# Patient Record
Sex: Male | Born: 1960 | Race: White | Hispanic: No | Marital: Single | Smoking: Current every day smoker
Health system: Southern US, Community
[De-identification: ages and names within clinical notes are randomized; demographics above are authoritative.]

## PROBLEM LIST (undated history)

## (undated) DIAGNOSIS — Z8619 Personal history of other infectious and parasitic diseases: Secondary | ICD-10-CM

## (undated) DIAGNOSIS — K219 Gastro-esophageal reflux disease without esophagitis: Secondary | ICD-10-CM

## (undated) DIAGNOSIS — G2581 Restless legs syndrome: Secondary | ICD-10-CM

## (undated) DIAGNOSIS — R42 Dizziness and giddiness: Secondary | ICD-10-CM

## (undated) DIAGNOSIS — F17210 Nicotine dependence, cigarettes, uncomplicated: Secondary | ICD-10-CM

## (undated) DIAGNOSIS — K5790 Diverticulosis of intestine, part unspecified, without perforation or abscess without bleeding: Secondary | ICD-10-CM

## (undated) DIAGNOSIS — Z8673 Personal history of transient ischemic attack (TIA), and cerebral infarction without residual deficits: Secondary | ICD-10-CM

## (undated) DIAGNOSIS — R600 Localized edema: Secondary | ICD-10-CM

## (undated) DIAGNOSIS — F17200 Nicotine dependence, unspecified, uncomplicated: Secondary | ICD-10-CM

## (undated) DIAGNOSIS — G4700 Insomnia, unspecified: Secondary | ICD-10-CM

## (undated) DIAGNOSIS — R5383 Other fatigue: Secondary | ICD-10-CM

## (undated) DIAGNOSIS — B354 Tinea corporis: Secondary | ICD-10-CM

## (undated) DIAGNOSIS — IMO0001 Reserved for inherently not codable concepts without codable children: Secondary | ICD-10-CM

## (undated) DIAGNOSIS — I10 Essential (primary) hypertension: Secondary | ICD-10-CM

## (undated) HISTORY — DX: Nicotine dependence, cigarettes, uncomplicated: F17.210

## (undated) HISTORY — DX: Nicotine dependence, unspecified, uncomplicated: F17.200

## (undated) HISTORY — DX: Personal history of other infectious and parasitic diseases: Z86.19

## (undated) HISTORY — DX: Insomnia, unspecified: G47.00

## (undated) HISTORY — DX: Localized edema: R60.0

## (undated) HISTORY — DX: Other fatigue: R53.83

## (undated) HISTORY — DX: Personal history of transient ischemic attack (TIA), and cerebral infarction without residual deficits: Z86.73

## (undated) HISTORY — DX: Reserved for inherently not codable concepts without codable children: IMO0001

## (undated) HISTORY — DX: Gastro-esophageal reflux disease without esophagitis: K21.9

## (undated) HISTORY — DX: Restless legs syndrome: G25.81

## (undated) HISTORY — DX: Dizziness and giddiness: R42

## (undated) HISTORY — DX: Essential (primary) hypertension: I10

## (undated) HISTORY — DX: Tinea corporis: B35.4

## (undated) HISTORY — DX: Diverticulosis of intestine, part unspecified, without perforation or abscess without bleeding: K57.90

---

## 2016-01-06 ENCOUNTER — Encounter (HOSPITAL_COMMUNITY): Payer: Self-pay | Admitting: Family Medicine

## 2016-01-06 ENCOUNTER — Emergency Department (HOSPITAL_COMMUNITY)
Admission: EM | Admit: 2016-01-06 | Discharge: 2016-01-06 | Disposition: A | Discharge status: Home / Self Care | Payer: Self-pay | Attending: Emergency Medicine | Admitting: Emergency Medicine

## 2016-01-06 ENCOUNTER — Emergency Department (HOSPITAL_COMMUNITY): Payer: Self-pay

## 2016-01-06 DIAGNOSIS — F17200 Nicotine dependence, unspecified, uncomplicated: Secondary | ICD-10-CM | POA: Insufficient documentation

## 2016-01-06 DIAGNOSIS — R079 Chest pain, unspecified: Secondary | ICD-10-CM | POA: Insufficient documentation

## 2016-01-06 DIAGNOSIS — Z79899 Other long term (current) drug therapy: Secondary | ICD-10-CM | POA: Insufficient documentation

## 2016-01-06 LAB — BASIC METABOLIC PANEL
ANION GAP: 12 (ref 5–15)
BUN: 8 mg/dL (ref 6–20)
CHLORIDE: 108 mmol/L (ref 101–111)
CO2: 20 mmol/L — AB (ref 22–32)
CREATININE: 0.95 mg/dL (ref 0.61–1.24)
Calcium: 9.4 mg/dL (ref 8.9–10.3)
GFR calc non Af Amer: >60 mL/min (ref >60)
GLUCOSE: 107 mg/dL — AB (ref 65–99)
Potassium: 4.1 mmol/L (ref 3.5–5.1)
Sodium: 140 mmol/L (ref 135–145)

## 2016-01-06 LAB — CBC
HCT: 46.5 % (ref 39.0–52.0)
HEMOGLOBIN: 16.1 g/dL (ref 13.0–17.0)
MCH: 31.3 pg (ref 26.0–34.0)
MCHC: 34.6 g/dL (ref 30.0–36.0)
MCV: 90.5 fL (ref 78.0–100.0)
Platelets: 268 10*3/uL (ref 150–400)
RBC: 5.14 MIL/uL (ref 4.22–5.81)
RDW: 12.3 % (ref 11.5–15.5)
WBC: 9.8 10*3/uL (ref 4.0–10.5)

## 2016-01-06 LAB — I-STAT TROPONIN, ED
Troponin i, poc: 0 ng/mL (ref 0.00–0.08)
Troponin i, poc: 0 ng/mL (ref 0.00–0.08)

## 2016-01-06 LAB — D-DIMER, QUANTITATIVE (NOT AT ARMC): D DIMER QUANT: 0.39 ug{FEU}/mL (ref 0.00–0.50)

## 2016-01-06 MED ORDER — CYCLOBENZAPRINE HCL 5 MG PO TABS: 5.0000 mg | ORAL_TABLET | Freq: Three times a day (TID) | ORAL | Status: DC | PRN

## 2016-01-06 MED ORDER — ASPIRIN 81 MG PO CHEW
324.0000 mg | CHEWABLE_TABLET | Freq: Once | ORAL | Status: AC
  Administered 2016-01-06: 324 mg via ORAL
  Filled 2016-01-06: qty 4

## 2016-01-06 MED ORDER — IBUPROFEN 400 MG PO TABS: 400.0000 mg | ORAL_TABLET | Freq: Four times a day (QID) | ORAL | Status: DC | PRN

## 2016-01-06 NOTE — ED Provider Notes (Signed)
CSN: [id number]     Arrival date & time 01/06/16  1810 History   First MD Initiated Contact with Patient 01/06/16 2152     Chief Complaint  Patient presents with  . Chest Pain   HPI Pt had sharp pain last week in his left chest shooting down the left hand left face.  The pain has been coming and going.  It is in the left chest.  It has lasted most of the day today.  No trouble with breathing.  No swelling anywhere.  Nothing seems to make it better or worse.   Today he had to come home from work and it bothered him to walk up the incline. History reviewed. No pertinent past medical history. History reviewed. No pertinent past surgical history. History reviewed. No pertinent family history. Social History  Substance Use Topics  . Smoking status: Current Every Day Smoker -- 0.50 packs/day  . Smokeless tobacco: None  . Alcohol Use: No    Review of Systems  All other systems reviewed and are negative.     Allergies  Review of patient's allergies indicates no known allergies.  Home Medications   Prior to Admission medications   Medication Sig Start Date End Date Taking? Authorizing Provider  cyclobenzaprine (FLEXERIL) 5 MG tablet Take 1 tablet (5 mg total) by mouth 3 (three) times daily as needed for muscle spasms. 01/06/16   Linwood DibblesJon Liela Rylee, MD  ibuprofen (ADVIL,MOTRIN) 400 MG tablet Take 1 tablet (400 mg total) by mouth every 6 (six) hours as needed. 01/06/16   Linwood DibblesJon Elvia Aydin, MD  ranitidine (ZANTAC) 150 MG tablet Take 150 mg by mouth 2 (two) times daily.   Yes Historical Provider, MD   BP 139/81 mmHg  Pulse 57  Temp(Src) 97.8 F (36.6 C)  Resp 19  Ht 5\' 9"  (1.753 m)  Wt 87.091 kg  BMI 28.34 kg/m2  SpO2 96% Physical Exam  Constitutional: He appears well-developed and well-nourished. No distress.  HENT:  Head: Normocephalic and atraumatic.  Right Ear: External ear normal.  Left Ear: External ear normal.  Eyes: Conjunctivae are normal. Right eye exhibits no discharge. Left eye exhibits  no discharge. No scleral icterus.  Neck: Neck supple. No tracheal deviation present.  ttp parapsinal region  Cardiovascular: Normal rate, regular rhythm and intact distal pulses.   Pulmonary/Chest: Effort normal and breath sounds normal. No stridor. No respiratory distress. He has no wheezes. He has no rales.  Abdominal: Soft. Bowel sounds are normal. He exhibits no distension. There is no tenderness. There is no rebound and no guarding.  Musculoskeletal: He exhibits no edema or tenderness.  Neurological: He is alert. He has normal strength. No cranial nerve deficit (no facial droop, extraocular movements intact, no slurred speech) or sensory deficit. He exhibits normal muscle tone. He displays no seizure activity. Coordination normal.  Skin: Skin is warm and dry. No rash noted.  Psychiatric: He has a normal mood and affect.  Nursing note and vitals reviewed.   ED Course  Procedures (including critical care time) Labs Review Labs Reviewed  BASIC METABOLIC PANEL - Abnormal; Notable for the following:    CO2 20 (*)    Glucose, Bld 107 (*)    All other components within normal limits  CBC  D-DIMER, QUANTITATIVE (NOT AT [facility])  I-STAT TROPOININ, ED  Rosezena SensorI-STAT TROPOININ, ED    Imaging Review Dg Chest 2 View  01/06/2016  CLINICAL DATA:  Acute onset of left anterior chest pain, radiating down the left arm, with numbness.  Left facial numbness. Initial encounter. EXAM: CHEST  2 VIEW COMPARISON:  None. FINDINGS: The lungs are well-aerated. Peribronchial thickening is noted. There is no evidence of focal opacification, pleural effusion or pneumothorax. The heart is normal in size; the mediastinal contour is within normal limits. No acute osseous abnormalities are seen. IMPRESSION: Peribronchial thickening noted.  Lungs otherwise grossly clear. Electronically Signed   By: Roanna Raider M.D.   On: 01/06/2016 18:57   I have personally reviewed and evaluated these images and lab results as part of my  medical decision-making.   EKG Interpretation   Date/Time:  Friday January 06 2016 18:18:55 EST Ventricular Rate:  62 PR Interval:  174 QRS Duration: 108 QT Interval:  400 QTC Calculation: 406 R Axis:   -58 Text Interpretation:  Normal sinus rhythm Pulmonary disease pattern  Incomplete right bundle branch block Left anterior fascicular block  Abnormal ECG No previous tracing Confirmed by Saraya Tirey  MD-J, Tayley Mudrick ([id number]) on  01/06/2016 9:52:38 PM      MDM   Final diagnoses:  Chest pain, unspecified chest pain type     Sx are atypical for heart disease.  Heart score of 3.  Serial cardiac enzymes and d dimer are negative.  Doubt ACS or PE.  May be radicular type pain.  Will dc home with rx for pain.  Follow up with a PCP.  Consider outpatient stress test.  Return for recurretn symptoms    Linwood Dibbles, MD 01/06/16 2320

## 2016-01-06 NOTE — ED Notes (Signed)
Pt here for chest pain that started this am. sts radiating down left arm. sts numbness also. sts last week he had an episode of left facial numbness, arm, and leg that lasted 20 minutes.

## 2016-01-06 NOTE — Discharge Instructions (Signed)

## 2016-11-12 ENCOUNTER — Encounter (HOSPITAL_COMMUNITY): Payer: Self-pay | Admitting: Emergency Medicine

## 2016-11-12 ENCOUNTER — Emergency Department (HOSPITAL_COMMUNITY)
Admission: EM | Admit: 2016-11-12 | Discharge: 2016-11-12 | Disposition: A | Discharge status: Home / Self Care | Payer: No Typology Code available for payment source | Attending: Emergency Medicine | Admitting: Emergency Medicine

## 2016-11-12 DIAGNOSIS — Y999 Unspecified external cause status: Secondary | ICD-10-CM | POA: Insufficient documentation

## 2016-11-12 DIAGNOSIS — Y9389 Activity, other specified: Secondary | ICD-10-CM | POA: Diagnosis not present

## 2016-11-12 DIAGNOSIS — S46819A Strain of other muscles, fascia and tendons at shoulder and upper arm level, unspecified arm, initial encounter: Secondary | ICD-10-CM | POA: Insufficient documentation

## 2016-11-12 DIAGNOSIS — Y92410 Unspecified street and highway as the place of occurrence of the external cause: Secondary | ICD-10-CM | POA: Diagnosis not present

## 2016-11-12 DIAGNOSIS — F17210 Nicotine dependence, cigarettes, uncomplicated: Secondary | ICD-10-CM | POA: Diagnosis not present

## 2016-11-12 DIAGNOSIS — S4990XA Unspecified injury of shoulder and upper arm, unspecified arm, initial encounter: Secondary | ICD-10-CM | POA: Diagnosis present

## 2016-11-12 MED ORDER — IBUPROFEN 600 MG PO TABS: 600.0000 mg | ORAL_TABLET | Freq: Four times a day (QID) | ORAL | 0 refills | Status: DC | PRN

## 2016-11-12 MED ORDER — CYCLOBENZAPRINE HCL 10 MG PO TABS: 10.0000 mg | ORAL_TABLET | Freq: Three times a day (TID) | ORAL | 0 refills | Status: DC

## 2016-11-12 MED ORDER — IBUPROFEN 800 MG PO TABS
800.0000 mg | ORAL_TABLET | Freq: Once | ORAL | Status: AC
  Administered 2016-11-12: 800 mg via ORAL
  Filled 2016-11-12: qty 1

## 2016-11-12 MED ORDER — CYCLOBENZAPRINE HCL 10 MG PO TABS
10.0000 mg | ORAL_TABLET | Freq: Once | ORAL | Status: AC
  Administered 2016-11-12: 10 mg via ORAL
  Filled 2016-11-12: qty 1

## 2016-11-12 MED ORDER — ACETAMINOPHEN 325 MG PO TABS
650.0000 mg | ORAL_TABLET | Freq: Once | ORAL | Status: AC
  Administered 2016-11-12: 650 mg via ORAL
  Filled 2016-11-12: qty 2
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
## 2016-11-21 ENCOUNTER — Emergency Department (HOSPITAL_COMMUNITY)
Admission: EM | Admit: 2016-11-21 | Discharge: 2016-11-21 | Disposition: A | Discharge status: Home / Self Care | Payer: No Typology Code available for payment source | Attending: Emergency Medicine | Admitting: Emergency Medicine

## 2016-11-21 ENCOUNTER — Encounter (HOSPITAL_COMMUNITY): Payer: Self-pay | Admitting: *Deleted

## 2016-11-21 ENCOUNTER — Emergency Department (HOSPITAL_COMMUNITY): Payer: No Typology Code available for payment source

## 2016-11-21 DIAGNOSIS — Y9389 Activity, other specified: Secondary | ICD-10-CM | POA: Insufficient documentation

## 2016-11-21 DIAGNOSIS — Y990 Civilian activity done for income or pay: Secondary | ICD-10-CM | POA: Diagnosis not present

## 2016-11-21 DIAGNOSIS — Z791 Long term (current) use of non-steroidal anti-inflammatories (NSAID): Secondary | ICD-10-CM | POA: Insufficient documentation

## 2016-11-21 DIAGNOSIS — Y92410 Unspecified street and highway as the place of occurrence of the external cause: Secondary | ICD-10-CM | POA: Insufficient documentation

## 2016-11-21 DIAGNOSIS — F17210 Nicotine dependence, cigarettes, uncomplicated: Secondary | ICD-10-CM | POA: Diagnosis not present

## 2016-11-21 DIAGNOSIS — M26622 Arthralgia of left temporomandibular joint: Secondary | ICD-10-CM | POA: Diagnosis not present

## 2016-11-21 DIAGNOSIS — S0993XA Unspecified injury of face, initial encounter: Secondary | ICD-10-CM | POA: Diagnosis present

## 2016-11-21 MED ORDER — NAPROXEN 500 MG PO TABS: 500.0000 mg | ORAL_TABLET | Freq: Two times a day (BID) | ORAL | 0 refills | Status: DC
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
## 2017-10-12 ENCOUNTER — Emergency Department (HOSPITAL_COMMUNITY): Payer: Self-pay

## 2017-10-12 ENCOUNTER — Encounter (HOSPITAL_COMMUNITY): Payer: Self-pay | Admitting: Emergency Medicine

## 2017-10-12 ENCOUNTER — Observation Stay (HOSPITAL_COMMUNITY)
Admission: EM | Admit: 2017-10-12 | Discharge: 2017-10-14 | Disposition: A | Discharge status: Home / Self Care | Payer: Self-pay | Attending: Family Medicine | Admitting: Family Medicine

## 2017-10-12 DIAGNOSIS — R1030 Lower abdominal pain, unspecified: Secondary | ICD-10-CM | POA: Insufficient documentation

## 2017-10-12 DIAGNOSIS — I10 Essential (primary) hypertension: Secondary | ICD-10-CM | POA: Diagnosis present

## 2017-10-12 DIAGNOSIS — R011 Cardiac murmur, unspecified: Secondary | ICD-10-CM | POA: Diagnosis present

## 2017-10-12 DIAGNOSIS — R4701 Aphasia: Secondary | ICD-10-CM | POA: Diagnosis present

## 2017-10-12 DIAGNOSIS — Z79899 Other long term (current) drug therapy: Secondary | ICD-10-CM | POA: Insufficient documentation

## 2017-10-12 DIAGNOSIS — K5792 Diverticulitis of intestine, part unspecified, without perforation or abscess without bleeding: Secondary | ICD-10-CM | POA: Diagnosis present

## 2017-10-12 DIAGNOSIS — I639 Cerebral infarction, unspecified: Principal | ICD-10-CM | POA: Insufficient documentation

## 2017-10-12 DIAGNOSIS — F17210 Nicotine dependence, cigarettes, uncomplicated: Secondary | ICD-10-CM | POA: Insufficient documentation

## 2017-10-12 DIAGNOSIS — R29898 Other symptoms and signs involving the musculoskeletal system: Secondary | ICD-10-CM | POA: Diagnosis present

## 2017-10-12 LAB — URINALYSIS, ROUTINE W REFLEX MICROSCOPIC
BACTERIA UA: NONE SEEN
Bilirubin Urine: NEGATIVE
Glucose, UA: NEGATIVE mg/dL
KETONES UR: NEGATIVE mg/dL
LEUKOCYTES UA: NEGATIVE
Nitrite: NEGATIVE
PH: 7 (ref 5.0–8.0)
PROTEIN: NEGATIVE mg/dL
SPECIFIC GRAVITY, URINE: 1.011 (ref 1.005–1.030)

## 2017-10-12 LAB — COMPREHENSIVE METABOLIC PANEL
ALBUMIN: 4.4 g/dL (ref 3.5–5.0)
ALT: 31 U/L (ref 17–63)
ANION GAP: 10 (ref 5–15)
AST: 24 U/L (ref 15–41)
Alkaline Phosphatase: 63 U/L (ref 38–126)
BILIRUBIN TOTAL: 1.1 mg/dL (ref 0.3–1.2)
BUN: 9 mg/dL (ref 6–20)
CHLORIDE: 104 mmol/L (ref 101–111)
CO2: 25 mmol/L (ref 22–32)
Calcium: 9.3 mg/dL (ref 8.9–10.3)
Creatinine, Ser: 0.81 mg/dL (ref 0.61–1.24)
GFR calc Af Amer: >60 mL/min (ref >60)
GFR calc non Af Amer: >60 mL/min (ref >60)
GLUCOSE: 119 mg/dL — AB (ref 65–99)
POTASSIUM: 3.8 mmol/L (ref 3.5–5.1)
SODIUM: 139 mmol/L (ref 135–145)
Total Protein: 7.5 g/dL (ref 6.5–8.1)

## 2017-10-12 LAB — DIFFERENTIAL
BASOS ABS: 0 10*3/uL (ref 0.0–0.1)
Basophils Relative: 0 %
EOS ABS: 0.1 10*3/uL (ref 0.0–0.7)
Eosinophils Relative: 1 %
LYMPHS ABS: 4.2 10*3/uL — AB (ref 0.7–4.0)
LYMPHS PCT: 30 %
Monocytes Absolute: 1 10*3/uL (ref 0.1–1.0)
Monocytes Relative: 7 %
NEUTROS PCT: 62 %
Neutro Abs: 8.8 10*3/uL — ABNORMAL HIGH (ref 1.7–7.7)

## 2017-10-12 LAB — I-STAT CHEM 8, ED
BUN: 7 mg/dL (ref 6–20)
CREATININE: 0.9 mg/dL (ref 0.61–1.24)
Calcium, Ion: 1.07 mmol/L — ABNORMAL LOW (ref 1.15–1.40)
Chloride: 102 mmol/L (ref 101–111)
Glucose, Bld: 114 mg/dL — ABNORMAL HIGH (ref 65–99)
HEMATOCRIT: 48 % (ref 39.0–52.0)
Hemoglobin: 16.3 g/dL (ref 13.0–17.0)
POTASSIUM: 3.9 mmol/L (ref 3.5–5.1)
SODIUM: 141 mmol/L (ref 135–145)
TCO2: 24 mmol/L (ref 22–32)

## 2017-10-12 LAB — I-STAT TROPONIN, ED: Troponin i, poc: 0 ng/mL (ref 0.00–0.08)

## 2017-10-12 LAB — RAPID URINE DRUG SCREEN, HOSP PERFORMED
AMPHETAMINES: NOT DETECTED
BARBITURATES: NOT DETECTED
BENZODIAZEPINES: NOT DETECTED
COCAINE: NOT DETECTED
OPIATES: NOT DETECTED
TETRAHYDROCANNABINOL: NOT DETECTED

## 2017-10-12 LAB — CBC
HCT: 46.8 % (ref 39.0–52.0)
HEMOGLOBIN: 16.4 g/dL (ref 13.0–17.0)
MCH: 32.5 pg (ref 26.0–34.0)
MCHC: 35 g/dL (ref 30.0–36.0)
MCV: 92.7 fL (ref 78.0–100.0)
PLATELETS: 248 10*3/uL (ref 150–400)
RBC: 5.05 MIL/uL (ref 4.22–5.81)
RDW: 12.7 % (ref 11.5–15.5)
WBC: 14 10*3/uL — ABNORMAL HIGH (ref 4.0–10.5)

## 2017-10-12 LAB — PROTIME-INR
INR: 0.99
PROTHROMBIN TIME: 13 s (ref 11.4–15.2)

## 2017-10-12 LAB — APTT: APTT: 26 s (ref 24–36)

## 2017-10-12 LAB — ETHANOL: Alcohol, Ethyl (B): <10 mg/dL (ref <10)

## 2017-10-12 MED ORDER — CIPROFLOXACIN IN D5W 400 MG/200ML IV SOLN
400.0000 mg | Freq: Once | INTRAVENOUS | Status: AC
  Administered 2017-10-12: 400 mg via INTRAVENOUS
  Filled 2017-10-12: qty 200

## 2017-10-12 MED ORDER — IOPAMIDOL (ISOVUE-300) INJECTION 61%
100.0000 mL | Freq: Once | INTRAVENOUS | Status: AC | PRN
  Administered 2017-10-12: 100 mL via INTRAVENOUS

## 2017-10-12 MED ORDER — METRONIDAZOLE IN NACL 5-0.79 MG/ML-% IV SOLN
500.0000 mg | Freq: Once | INTRAVENOUS | Status: AC
  Administered 2017-10-12: 500 mg via INTRAVENOUS
  Filled 2017-10-12: qty 100

## 2017-10-12 MED ORDER — SENNOSIDES-DOCUSATE SODIUM 8.6-50 MG PO TABS: 1.0000 | ORAL_TABLET | Freq: Every evening | ORAL | Status: DC | PRN

## 2017-10-12 MED ORDER — ENOXAPARIN SODIUM 40 MG/0.4ML ~~LOC~~ SOLN
40.0000 mg | SUBCUTANEOUS | Status: DC
  Administered 2017-10-13 – 2017-10-14 (×2): 40 mg via SUBCUTANEOUS
  Filled 2017-10-12 (×2): qty 0.4

## 2017-10-12 MED ORDER — ACETAMINOPHEN 325 MG PO TABS: 650.0000 mg | ORAL_TABLET | ORAL | Status: DC | PRN

## 2017-10-12 MED ORDER — ACETAMINOPHEN 650 MG RE SUPP: 650.0000 mg | RECTAL | Status: DC | PRN

## 2017-10-12 MED ORDER — ACETAMINOPHEN 160 MG/5ML PO SOLN: 650.0000 mg | ORAL | Status: DC | PRN

## 2017-10-12 MED ORDER — SODIUM CHLORIDE 0.9 % IV BOLUS (SEPSIS)
1000.0000 mL | Freq: Once | INTRAVENOUS | Status: AC
  Administered 2017-10-12: 1000 mL via INTRAVENOUS

## 2017-10-12 MED ORDER — STROKE: EARLY STAGES OF RECOVERY BOOK
Freq: Once | Status: DC
  Filled 2017-10-12: qty 1

## 2017-10-12 NOTE — ED Provider Notes (Signed)
Emergency Department Provider Note   I have reviewed the triage vital signs and the nursing notes.   HISTORY  Chief Complaint Weakness   HPI Harold Guerrero is a 56 y.o. male a known past medical history the presents the emergency department today secondary to weakness.  Patient states that this morning woke up and he seemed to be weak in both of his legs.  This improved but then patient started having difficulty speaking and right hand weakness.  Apparently he had episode of unresponsiveness and thus was brought here for further evaluation.  Patient has no history of any symptoms like this.  No recent illnesses.  No sick contacts.  No recent travels.  The time of my evaluation patient still complaining of some right-sided arm weakness and right leg weakness and having some word finding difficulties.  No chest pain, back pain, urinary symptoms, respiratory symptoms, rashes or other associated symptoms.    History reviewed. No pertinent past medical history.  There are no active problems to display for this patient.   History reviewed. No pertinent surgical history.  Current Outpatient Rx  . Order #: [id number] Class: Historical Med    Allergies Patient has no known allergies.  History reviewed. No pertinent family history.  Social History Social History  Substance Use Topics  . Smoking status: Current Every Day Smoker    Packs/day: 0.50    Types: Cigarettes  . Smokeless tobacco: Never Used  . Alcohol use No    Review of Systems  All other systems negative except as documented in the HPI. All pertinent positives and negatives as reviewed in the HPI. ____________________________________________   PHYSICAL EXAM:  VITAL SIGNS: ED Triage Vitals  Enc Vitals Group     BP 10/12/17 1525 (!) 170/80     Pulse Rate 10/12/17 1525 74     Resp 10/12/17 1525 16     Temp 10/12/17 1525 98.4 F (36.9 C)     Temp Source 10/12/17 1525 Oral     SpO2 10/12/17 1525 100 %   Weight 10/12/17 1526 200 lb (90.7 kg)     Height 10/12/17 1526 6' (1.829 m)    Constitutional: Alert and oriented. Well appearing and in no acute distress. Eyes: Conjunctivae are normal. PERRL. EOMI. Head: Atraumatic. Nose: No congestion/rhinnorhea. Mouth/Throat: Mucous membranes are moist.  Oropharynx non-erythematous. Neck: No stridor.  No meningeal signs.   Cardiovascular: Normal rate, regular rhythm. Good peripheral circulation. II/VI systoclic heart murmur.  Respiratory: Normal respiratory effort.  No retractions. Lungs CTAB. Gastrointestinal: Soft and nontender. No distention.  Musculoskeletal: No lower extremity tenderness nor edema. No gross deformities of extremities. Neurologic:  Normal speech and language.  Seems to be weak with his right arm and leg.  Cranial nerves are intact.  Sensation is intact.  Did have a couple episodes of word finding difficulties.  Finger to nose normal.  Extraocular movements intact. Skin:  Skin is warm, dry and intact. No rash noted.  ____________________________________________   LABS (all labs ordered are listed, but only abnormal results are displayed)  Labs Reviewed  CBC - Abnormal; Notable for the following:       Result Value   WBC 14.0 (*)    All other components within normal limits  DIFFERENTIAL - Abnormal; Notable for the following:    Neutro Abs 8.8 (*)    Lymphs Abs 4.2 (*)    All other components within normal limits  COMPREHENSIVE METABOLIC PANEL - Abnormal; Notable for the following:  Glucose, Bld 119 (*)    All other components within normal limits  I-STAT CHEM 8, ED - Abnormal; Notable for the following:    Glucose, Bld 114 (*)    Calcium, Ion 1.07 (*)    All other components within normal limits  ETHANOL  PROTIME-INR  APTT  RAPID URINE DRUG SCREEN, HOSP PERFORMED  URINALYSIS, ROUTINE W REFLEX MICROSCOPIC  I-STAT TROPONIN, ED   ____________________________________________  EKG   EKG  Interpretation  Date/Time:  Saturday October 12 2017 15:40:51 EDT Ventricular Rate:  69 PR Interval:    QRS Duration: 106 QT Interval:  410 QTC Calculation: 440 R Axis:   -68 Text Interpretation:  Sinus rhythm Left anterior fascicular block Abnormal R-wave progression, late transition Confirmed by Marily Memos [phone]) on 10/12/2017 4:00:49 PM       ____________________________________________  RADIOLOGY  Ct Head Wo Contrast  Result Date: 10/12/2017 CLINICAL DATA:  Focal neuro deficit greater than 6 hours. Suspect stroke EXAM: CT HEAD WITHOUT CONTRAST TECHNIQUE: Contiguous axial images were obtained from the base of the skull through the vertex without intravenous contrast. COMPARISON:  CT head report 01/03/2016 FINDINGS: Brain: No evidence of acute infarction, hemorrhage, hydrocephalus, extra-axial collection or mass lesion/mass effect. Vascular: No hyperdense vessel or unexpected calcification. Skull: Negative Sinuses/Orbits: Mucosal edema in the ethmoid sinuses. Remaining sinuses clear. Negative orbit. Other: None IMPRESSION: Negative CT head Electronically Signed   By: Marlan Palau M.D.   On: 10/12/2017 16:17    ____________________________________________   PROCEDURES  Procedure(s) performed:   Procedures   ____________________________________________   INITIAL IMPRESSION / ASSESSMENT AND PLAN / ED COURSE  Pertinent labs & imaging results that were available during my care of the patient were reviewed by me and considered in my medical decision making (see chart for details).  Seems to have some generalized weakness but then also some right-sided deficits.  Unclear last known normal sounds like it was last night.  Thus not a TPA candidate or code stroke candidate.  Does have a new heart murmur which could be related to the weakness and if there was some kind of clot there could be related to his deficits.  CT and rest of his workup seems to be appropriate.  We will  talk to medicine about admission for stroke workup.  fruther evaluation provides he has had decreased PO intake and llq abdominal pain so ct scan done which shows diverticulitis. Abx/fluids given. Will admit.   ____________________________________________  FINAL CLINICAL IMPRESSION(S) / ED DIAGNOSES  Final diagnoses:  None     MEDICATIONS GIVEN DURING THIS VISIT:  Medications - No data to display   NEW OUTPATIENT MEDICATIONS STARTED DURING THIS VISIT:  New Prescriptions   No medications on file    Note:  This document was prepared using Dragon voice recognition software and may include unintentional dictation errors.   Marily Memos, MD 10/12/17 [phone]

## 2017-10-12 NOTE — ED Notes (Signed)
Pt reports he woke up 0730 this morning . Attempting to hold a cup of coffee with right hand but dropped cup, had to use left hand to get coffee. Reports bilateral leg weakness approx 0830 and fell. Son assisted pt tchair. Says he has been in chair for the majority of day and if he had to walk gait was shuffling. approx 1430-1500 he was unable to speak and when he did it was not clear. Family brought into ED at that point

## 2017-10-12 NOTE — ED Notes (Signed)
Pt transported to CT ?

## 2017-10-12 NOTE — H&P (Addendum)
History and Physical  Harold Guerrero ZOX:[id number] DOB: [birth year] DOA: 10/12/2017  Referring physician: Dr. Clayborne Dana, ED physician PCP: Patient, No Pcp Per  Outpatient Specialists: None  Patient Coming From: Home  Chief Complaint: Weakness, aphasia, left lower quadrant abdominal pain  HPI: Harold Guerrero is a 56 y.o. male who is otherwise healthy.  Patient seen for weakness and difficulty speaking that occurred earlier today.  Earlier this morning, the patient had difficulty grasping his coffee cup with his right hand and was unable to lift it.  He attempted to put his coffee in the microwave was having difficulty coordinating his movements to get the cup in the microwave.  After resting for a little bit, the patient found his movements improved, but not back to baseline.  Additionally, the patient had attempted to walk after resting and both of the patient's legs gave out and he fell on the floor.  When he attempted to call his boss, the patient had an episode of aphasia -he knew what he wanted to say, however all of his words do not make sense.  After this, the patient talked with his daughter and he was brought to the hospital for evaluation.  Additionally, the patient has been complaining of abdominal pain and loss of appetite over the past 2 days.  Patient had superior Wednesday evening and since then has had abdominal pain.  Eating has worsened his pain.  He has had very little to eat.  No other palliating or provoking factors.  No blood in his stool.  Denies diarrhea, constipation.  Emergency Department Course: Patient's neurological symptoms have been improving since arrival at the hospital -initially he was noted to have slurred speech with little movement of his right corner of his mouth.  However this has improved greatly and he appears to be talking normally.  CT of the head was negative.  CT abdomen shows diverticulitis  Review of Systems:   Pt denies any fevers, chills, nausea,  vomiting, diarrhea, constipation, abdominal pain, shortness of breath, dyspnea on exertion, orthopnea, cough, wheezing, palpitations, headache, vision changes, lightheadedness, dizziness, melena, rectal bleeding.  Review of systems are otherwise negative  History reviewed. No pertinent past medical history. History reviewed. No pertinent surgical history. Social History:  reports that he has been smoking Cigarettes.  He has been smoking about 0.50 packs per day. He has never used smokeless tobacco. He reports that he does not drink alcohol or use drugs. Patient lives at home  No Known Allergies  History reviewed. No pertinent family history.  Unknown by patient  Prior to Admission medications   Medication Sig Start Date End Date Taking? Authorizing Provider  ibuprofen (ADVIL,MOTRIN) 200 MG tablet Take 600 mg by mouth every 6 (six) hours as needed for headache or moderate pain.   Yes [provider]    Physical Exam: BP (!) 150/81   Pulse 65   Temp 98.4 F (36.9 C)   Resp 19   Ht 6' (1.829 m)   Wt 90.7 kg (200 lb)   SpO2 97%   BMI 27.12 kg/m   General: Middle-aged Caucasian male. Awake and alert and oriented x3. No acute cardiopulmonary distress.  HEENT: Normocephalic atraumatic.  Right and left ears normal in appearance.  Pupils equal, round, reactive to light. Extraocular muscles are intact. Sclerae anicteric and noninjected.  Moist mucosal membranes. No mucosal lesions.  Neck: Neck supple without lymphadenopathy. No carotid bruits. No masses palpated.  Cardiovascular: Regular rate with normal S1-S2 sounds. No murmurs, rubs,  gallops auscultated. No JVD.  Respiratory: Good respiratory effort with no wheezes, rales, rhonchi. Lungs clear to auscultation bilaterally.  No accessory muscle use. Abdomen: Soft, tender in the left lower quadrant.  No rebound tenderness, guarding, nondistended. Active bowel sounds. No masses or hepatosplenomegaly  Skin: No rashes, lesions, or  ulcerations.  Dry, warm to touch. 2+ dorsalis pedis and radial pulses. Musculoskeletal: No calf or leg pain. All major joints not erythematous nontender.  No upper or lower joint deformation.  Good ROM.  No contractures  Psychiatric: Intact judgment and insight. Pleasant and cooperative. Neurologic: No focal neurological deficits. Strength is 5/5 and symmetric in upper and lower extremities.  DTRs 2 out of 4.  Sensation intact to light touch.  Coordination intact.  Cranial nerves II through XII are grossly intact.           Labs on Admission: I have personally reviewed following labs and imaging studies  CBC:  Recent Labs Lab 10/12/17 1526 10/12/17 1537  WBC 14.0*  --   NEUTROABS 8.8*  --   HGB 16.4 16.3  HCT 46.8 48.0  MCV 92.7  --   PLT 248  --    Basic Metabolic Panel:  Recent Labs Lab 10/12/17 1526 10/12/17 1537  NA 139 141  K 3.8 3.9  CL 104 102  CO2 25  --   GLUCOSE 119* 114*  BUN 9 7  CREATININE 0.81 0.90  CALCIUM 9.3  --    GFR: Estimated Creatinine Clearance: 100.6 mL/min (by C-G formula based on SCr of 0.9 mg/dL). Liver Function Tests:  Recent Labs Lab 10/12/17 1526  AST 24  ALT 31  ALKPHOS 63  BILITOT 1.1  PROT 7.5  ALBUMIN 4.4   No results for input(s): LIPASE, AMYLASE in the last 168 hours. No results for input(s): AMMONIA in the last 168 hours. Coagulation Profile:  Recent Labs Lab 10/12/17 1526  INR 0.99   Cardiac Enzymes: No results for input(s): CKTOTAL, CKMB, CKMBINDEX, TROPONINI in the last 168 hours. BNP (last 3 results) No results for input(s): PROBNP in the last 8760 hours. HbA1C: No results for input(s): HGBA1C in the last 72 hours. CBG: No results for input(s): GLUCAP in the last 168 hours. Lipid Profile: No results for input(s): CHOL, HDL, LDLCALC, TRIG, CHOLHDL, LDLDIRECT in the last 72 hours. Thyroid Function Tests: No results for input(s): TSH, T4TOTAL, FREET4, T3FREE, THYROIDAB in the last 72 hours. Anemia  Panel: No results for input(s): VITAMINB12, FOLATE, FERRITIN, TIBC, IRON, RETICCTPCT in the last 72 hours. Urine analysis:    Component Value Date/Time   COLORURINE YELLOW 10/12/2017 1526   APPEARANCEUR CLEAR 10/12/2017 1526   LABSPEC 1.011 10/12/2017 1526   PHURINE 7.0 10/12/2017 1526   GLUCOSEU NEGATIVE 10/12/2017 1526   HGBUR SMALL (A) 10/12/2017 1526   BILIRUBINUR NEGATIVE 10/12/2017 1526   KETONESUR NEGATIVE 10/12/2017 1526   PROTEINUR NEGATIVE 10/12/2017 1526   NITRITE NEGATIVE 10/12/2017 1526   LEUKOCYTESUR NEGATIVE 10/12/2017 1526   Sepsis Labs: @LABRCNTIP (procalcitonin:4,lacticidven:4) )No results found for this or any previous visit (from the past 240 hour(s)).   Radiological Exams on Admission: Dg Chest 2 View  Result Date: 10/12/2017 CLINICAL DATA:  Right arm and leg weakness. Slurred speech. Possible stroke. EXAM: CHEST  2 VIEW COMPARISON:  01/06/2016 FINDINGS: The heart size and mediastinal contours are within normal limits. Both lungs are clear. The visualized skeletal structures are unremarkable. IMPRESSION: No active cardiopulmonary disease. Electronically Signed   By: Myles RosenthalJohn  Stahl M.D.   On: 10/12/2017  17:22   Ct Head Wo Contrast  Result Date: 10/12/2017 CLINICAL DATA:  Focal neuro deficit greater than 6 hours. Suspect stroke EXAM: CT HEAD WITHOUT CONTRAST TECHNIQUE: Contiguous axial images were obtained from the base of the skull through the vertex without intravenous contrast. COMPARISON:  CT head report 01/03/2016 FINDINGS: Brain: No evidence of acute infarction, hemorrhage, hydrocephalus, extra-axial collection or mass lesion/mass effect. Vascular: No hyperdense vessel or unexpected calcification. Skull: Negative Sinuses/Orbits: Mucosal edema in the ethmoid sinuses. Remaining sinuses clear. Negative orbit. Other: None IMPRESSION: Negative CT head Electronically Signed   By: Marlan Palau M.D.   On: 10/12/2017 16:17   Ct Abdomen Pelvis W Contrast  Result Date:  10/12/2017 CLINICAL DATA:  Lower abdominal pain for several days. EXAM: CT ABDOMEN AND PELVIS WITH CONTRAST TECHNIQUE: Multidetector CT imaging of the abdomen and pelvis was performed using the standard protocol following bolus administration of intravenous contrast. CONTRAST:  ISOVUE-300 IOPAMIDOL (ISOVUE-300) INJECTION 61% COMPARISON:  None. FINDINGS: Lower Chest: No acute findings. Hepatobiliary: No hepatic masses identified. Cyst in the anterior right hepatic lobe noted. Gallbladder is unremarkable. No evidence of biliary ductal dilatation. Pancreas:  No mass or inflammatory changes. Spleen: Within normal limits in size and appearance. Adrenals/Urinary Tract: No masses identified. No evidence of hydronephrosis. 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 Stomach/Bowel: Mild diverticulitis involving distal descending colon. No evidence of abscess or bowel obstruction. Vascular/Lymphatic: No pathologically enlarged lymph nodes. No abdominal aortic aneurysm. Aortic atherosclerosis. Reproductive:  No mass or other significant abnormality. Other:  None. Musculoskeletal:  No suspicious bone lesions identified. IMPRESSION: Mild diverticulitis involving distal descending colon. No evidence of abscess or other complication. Electronically Signed   By: Myles Rosenthal M.D.   On: 10/12/2017 18:26    EKG: Independently reviewed.  Sinus rhythm with left anterior fascicular block  Assessment/Plan: Principal Problem:   Aphasia Active Problems:   Diverticulitis   Right arm weakness   Essential hypertension   Heart murmur    This patient was discussed with the ED physician, including pertinent vitals, physical exam findings, labs, and imaging.  We also discussed care given by the ED provider.  1.  Aphasia 2.  Right arm weakness  Observation on telemetry  Uncertain of whether this is related to the patient's diverticulitis versus TIA versus ischemic stroke.  We will rule  out stroke MRI/MRA head MR neck Echocardiogram tomorrow Hemoglobin A1c, lipid panel in the morning PT/OT/speech therapy consult Full aspirin 3.  Diverticulitis  Cipro, Flagyl  CBC in the morning 4.  Essential hypertension  We will allow for permissive hypertension at this point. 5.  Heart murmur  Although his murmur was not heard on my exam, we will obtain echo  DVT prophylaxis: Lovenox Consultants: None Code Status: Full code Family Communication: Daughter and ex-wife present during interview Disposition Plan: Patient to return home following admission   Noralee Chars [facility] Pager [phone]  If 7PM-7AM, please contact night-coverage [email or web address] Password [email or web address]

## 2017-10-12 NOTE — ED Notes (Signed)
Pt returned from CT °

## 2017-10-12 NOTE — ED Triage Notes (Signed)
Pt states he has been having trouble with his right hand and leg all day today with intermittent speech changes.  Pt was "unresponsive" on arrival to ED but began holding a conversation with stimuli.

## 2017-10-12 NOTE — ED Notes (Signed)
Report to Renee, RN [phone] 

## 2017-10-13 ENCOUNTER — Observation Stay (HOSPITAL_BASED_OUTPATIENT_CLINIC_OR_DEPARTMENT_OTHER): Payer: Self-pay

## 2017-10-13 ENCOUNTER — Observation Stay (HOSPITAL_COMMUNITY): Payer: Self-pay

## 2017-10-13 DIAGNOSIS — I10 Essential (primary) hypertension: Secondary | ICD-10-CM

## 2017-10-13 DIAGNOSIS — R011 Cardiac murmur, unspecified: Secondary | ICD-10-CM

## 2017-10-13 LAB — LIPID PANEL
CHOLESTEROL: 152 mg/dL (ref 0–200)
HDL: 34 mg/dL — AB (ref >40)
LDL Cholesterol: 89 mg/dL (ref 0–99)
TRIGLYCERIDES: 145 mg/dL (ref <150)
Total CHOL/HDL Ratio: 4.5 RATIO
VLDL: 29 mg/dL (ref 0–40)

## 2017-10-13 LAB — HEMOGLOBIN A1C
Hgb A1c MFr Bld: 5.4 % (ref 4.8–5.6)
Mean Plasma Glucose: 108.28 mg/dL

## 2017-10-13 LAB — CBC
HEMATOCRIT: 45.5 % (ref 39.0–52.0)
Hemoglobin: 15.6 g/dL (ref 13.0–17.0)
MCH: 31.7 pg (ref 26.0–34.0)
MCHC: 34.3 g/dL (ref 30.0–36.0)
MCV: 92.5 fL (ref 78.0–100.0)
Platelets: 249 10*3/uL (ref 150–400)
RBC: 4.92 MIL/uL (ref 4.22–5.81)
RDW: 12.8 % (ref 11.5–15.5)
WBC: 11 10*3/uL — ABNORMAL HIGH (ref 4.0–10.5)

## 2017-10-13 LAB — ECHOCARDIOGRAM COMPLETE
Height: 69 in
Weight: 2994.73 oz

## 2017-10-13 MED ORDER — ASPIRIN EC 325 MG PO TBEC
325.0000 mg | DELAYED_RELEASE_TABLET | Freq: Every day | ORAL | Status: DC
  Administered 2017-10-14: 325 mg via ORAL
  Filled 2017-10-13: qty 1

## 2017-10-13 MED ORDER — ASPIRIN EC 81 MG PO TBEC
81.0000 mg | DELAYED_RELEASE_TABLET | Freq: Every day | ORAL | Status: DC
  Administered 2017-10-13: 81 mg via ORAL
  Filled 2017-10-13: qty 1

## 2017-10-13 MED ORDER — SODIUM CHLORIDE 0.9 % IV SOLN
INTRAVENOUS | Status: AC
  Filled 2017-10-13: qty 50

## 2017-10-13 MED ORDER — CIPROFLOXACIN IN D5W 400 MG/200ML IV SOLN
400.0000 mg | Freq: Two times a day (BID) | INTRAVENOUS | Status: DC
  Administered 2017-10-13 – 2017-10-14 (×3): 400 mg via INTRAVENOUS
  Filled 2017-10-13 (×3): qty 200

## 2017-10-13 MED ORDER — METRONIDAZOLE IN NACL 5-0.79 MG/ML-% IV SOLN
500.0000 mg | Freq: Three times a day (TID) | INTRAVENOUS | Status: DC
  Administered 2017-10-13 – 2017-10-14 (×4): 500 mg via INTRAVENOUS
  Filled 2017-10-13 (×4): qty 100

## 2017-10-13 NOTE — Progress Notes (Signed)
Echocardiogram 2D Echocardiogram has been performed.  Harold PartridgeBrooke S Dulcemaria Guerrero 10/13/2017, 9:18 AM

## 2017-10-13 NOTE — Evaluation (Signed)
Physical Therapy Evaluation Patient Details Name: Harold Guerrero MRN: [id number] DOB: Jul 10, 1961 Today's Date: 10/13/2017   History of Present Illness  Harold Guerrero is a 56 y.o. male who is otherwise healthy.  Patient seen for weakness and difficulty speaking that occurred earlier today.  Earlier this morning, the patient had difficulty grasping his coffee cup with his right hand and was unable to lift it.  He attempted to put his coffee in the microwave was having difficulty coordinating his movements to get the cup in the microwave.  After resting for a little bit, the patient found his movements improved, but not back to baseline.  Additionally, the patient had attempted to walk after resting and both of the patient's legs gave out and he fell on the floor.  When he attempted to call his boss, the patient had an episode of aphasia -he knew what he wanted to say, however all of his words do not make sense.  After this, the patient talked with his daughter and he was brought to the hospital for evaluation.    Clinical Impression  Patient functioning at baseline with no c/o symptoms, demonstrates good return for ambulation in hallway and up/down stairs without loss of balance or assistance.  Patient discharged from physical therapy to care of nursing for ambulation daily as tolerated for length of stay.    Follow Up Recommendations No PT follow up    Equipment Recommendations  None recommended by PT    Recommendations for Other Services       Precautions / Restrictions Precautions Precautions: None Restrictions Weight Bearing Restrictions: No      Mobility  Bed Mobility Overal bed mobility: Independent                Transfers Overall transfer level: Independent                  Ambulation/Gait Ambulation/Gait assistance: Independent Ambulation Distance (Feet): 150 Feet Assistive device: None Gait Pattern/deviations: WFL(Within Functional Limits)   Gait  velocity interpretation: at or above normal speed for age/gender    Stairs Stairs: Yes Stairs assistance: Modified independent (Device/Increase time) Stair Management: One rail Right Number of Stairs: 18 General stair comments: Good return for going up/down stairs without loss of balance  Wheelchair Mobility    Modified Rankin (Stroke Patients Only)       Balance Overall balance assessment: Independent                                           Pertinent Vitals/Pain Pain Assessment: No/denies pain    Home Living Family/patient expects to be discharged to:: Private residence Living Arrangements: Children Available Help at Discharge: Family Type of Home: House Home Access: Stairs to enter;Ramped entrance Entrance Stairs-Rails: Left;Right;Can reach both Secretary/administrator of Steps: 4 Home Layout: One level Home Equipment: None      Prior Function Level of Independence: Independent         Comments: works     Higher education careers adviser   Dominant Hand: Right    Extremity/Trunk Assessment   Upper Extremity Assessment Upper Extremity Assessment: Overall WFL for tasks assessed    Lower Extremity Assessment Lower Extremity Assessment: Overall WFL for tasks assessed    Cervical / Trunk Assessment Cervical / Trunk Assessment: Normal  Communication   Communication: No difficulties  Cognition Arousal/Alertness: Awake/alert Behavior During Therapy: WFL for tasks assessed/performed  Overall Cognitive Status: Within Functional Limits for tasks assessed                                        General Comments      Exercises     Assessment/Plan    PT Assessment Patent does not need any further PT services  PT Problem List         PT Treatment Interventions      PT Goals (Current goals can be found in the Care Plan section)  Acute Rehab PT Goals Patient Stated Goal: return home and go to work tommorrow PT Goal Formulation:  With patient/family Time For Goal Achievement: 10/13/17 Potential to Achieve Goals: Good    Frequency     Barriers to discharge        Co-evaluation               AM-PAC PT "6 Clicks" Daily Activity  Outcome Measure Difficulty turning over in bed (including adjusting bedclothes, sheets and blankets)?: None Difficulty moving from lying on back to sitting on the side of the bed? : None Difficulty sitting down on and standing up from a chair with arms (e.g., wheelchair, bedside commode, etc,.)?: None Help needed moving to and from a bed to chair (including a wheelchair)?: None Help needed walking in hospital room?: None Help needed climbing 3-5 steps with a railing? : None 6 Click Score: 24    End of Session   Activity Tolerance: Patient tolerated treatment well Patient left: in bed;with call bell/phone within reach;with family/visitor present Nurse Communication: Mobility status      Time: 0730-0805 PT Time Calculation (min) (ACUTE ONLY): 35 min   Charges:   PT Evaluation $PT Eval Low Complexity: 1 Low PT Treatments $Therapeutic Activity: 23-37 mins   PT G Codes:   PT G-Codes **NOT FOR INPATIENT CLASS** Functional Assessment Tool Used: AM-PAC 6 Clicks Basic Mobility Functional Limitation: Mobility: Walking and moving around Mobility: Walking and Moving Around Current Status (Z6109(G8978): 0 percent impaired, limited or restricted Mobility: Walking and Moving Around Goal Status (U0454(G8979): 0 percent impaired, limited or restricted Mobility: Walking and Moving Around Discharge Status (U9811(G8980): 0 percent impaired, limited or restricted    8:08 AM, 10/13/17 Ocie BobJames Jarquez Mestre, MPT Physical Therapist with [facility] [phone] office [phone] mobile phone

## 2017-10-13 NOTE — Progress Notes (Signed)
PROGRESS NOTE    Harold Guerrero  BJY:[id number] DOB: 05-16-1961 DOA: 10/12/2017 PCP: Patient, No Pcp Per    Brief Narrative:  Harold Guerrero is a 56 y.o. male who is otherwise healthy.  Patient seen for weakness and difficulty speaking that occurred earlier today.  Earlier this morning, the patient had difficulty grasping his coffee cup with his right hand and was unable to lift it.  He attempted to put his coffee in the microwave was having difficulty coordinating his movements to get the cup in the microwave.  After resting for a little bit, the patient found his movements improved, but not back to baseline.  Additionally, the patient had attempted to walk after resting and both of the patient's legs gave out and he fell on the floor.  When he attempted to call his boss, the patient had an episode of aphasia -he knew what he wanted to say, however all of his words do not make sense.  After this, the patient talked with his daughter and he was brought to the hospital for evaluation.  Additionally, the patient has been complaining of abdominal pain and loss of appetite over the past 2 days.  Patient had superior Wednesday evening and since then has had abdominal pain.  Eating has worsened his pain.  He has had very little to eat.  No other palliating or provoking factors.  No blood in his stool.  Denies diarrhea, constipation.  Emergency Department Course: Patient's neurological symptoms have been improving since arrival at the hospital -initially he was noted to have slurred speech with little movement of his right corner of his mouth.  However this has improved greatly and he appears to be talking normally.  CT of the head was negative.  CT abdomen shows diverticulitis   Assessment & Plan:   Principal Problem:   Aphasia Active Problems:   Diverticulitis   Right arm weakness   Essential hypertension   Heart murmur   1.  Aphasia with Right arm weakness - MRI: Multiple foci of  restricted diffusion and over the LEFT hemisphere, at least two, possibly three, areas of the nonhemorrhagic acute infarction, LEFT MCA distribution. These could represent a shower of emboli or multiple thrombotic events. Normal for age cerebral volume with mild small vessel disease. - MRA head: Intracranial atherosclerotic disease as described. These changes are most pronounced in the M2 and M3 segments bilaterally. No proximal flow-limiting stenosis of the internal carotid arteries, dominant vertebral artery, or basilar artery - MR neck: Multilevel spondylosis, worst at C5-C6. Trace retrolisthesis, central disc pathology, and bony overgrowth contribute to BILATERAL C6 foraminal narrowing, worse on the LEFT.  - Echocardiogram showing: Moderate LVH with LVEF 55-60%. Indeterminate diastolic function. Trivial mitral and tricuspid regurgitation - Hemoglobin A1c of 5.4 - lipid panel in the morning - PT/OT/speech therapy consult - Full aspirin   2. Diverticulitis - Cipro, Flagyl - CBC in the morning  3. Essential hypertension - continue to allow for permissive HTN  4. Heart murmur Echo results above  DVT prophylaxis: Lovenox Code Status: Full code Family Communication: Daughter and ex-wife present during interview Disposition Plan: likely discharge tomorrow after evaluation by neuro   Consultants:   Neurology   Procedures:   echo  Antimicrobials:   Ciprofloxacin  Flagyl    Subjective: Patient and family are in room.  He just returned from MRI.  He ate lunch without nausea or cramping.  No weakness or numbness.  Objective: Vitals:   10/13/17 0031 10/13/17 0230 10/13/17  0430 10/13/17 0630  BP: (!) 143/76 (!) 142/71 (!) 143/71 137/64  Pulse: 60 68 66 60  Resp: 16 16 16 17   Temp: 98 F (36.7 C) 98.4 F (36.9 C) 98.3 F (36.8 C) 98.1 F (36.7 C)  TempSrc: Oral Oral Oral Oral  SpO2: 97% 98% 97% 96%  Weight:      Height:        Intake/Output Summary (Last 24  hours) at 10/13/17 1231 Last data filed at 10/13/17 0900  Gross per 24 hour  Intake              400 ml  Output                0 ml  Net              400 ml   Filed Weights   10/12/17 1526 10/12/17 2230  Weight: 90.7 kg (200 lb) 84.9 kg (187 lb 2.7 oz)    Examination:  General exam: Appears calm and comfortable  Respiratory system: Clear to auscultation. Respiratory effort normal. Cardiovascular system: S1 & S2 heard, RRR. No murmur appreciated. No JVD,  rubs, gallops or clicks. No pedal edema. Gastrointestinal system: Abdomen is nondistended, soft and nontender. No organomegaly or masses felt. Normal bowel sounds heard. Central nervous system: Alert and oriented. No focal neurological deficits. Extremities: Symmetric 5 x 5 power. Skin: No rashes, lesions or ulcers Psychiatry: Judgement and insight appear normal. Mood & affect appropriate.     Data Reviewed: I have personally reviewed following labs and imaging studies  CBC:  Recent Labs Lab 10/12/17 1526 10/12/17 1537 10/13/17 0642  WBC 14.0*  --  11.0*  NEUTROABS 8.8*  --   --   HGB 16.4 16.3 15.6  HCT 46.8 48.0 45.5  MCV 92.7  --  92.5  PLT 248  --  249   Basic Metabolic Panel:  Recent Labs Lab 10/12/17 1526 10/12/17 1537  NA 139 141  K 3.8 3.9  CL 104 102  CO2 25  --   GLUCOSE 119* 114*  BUN 9 7  CREATININE 0.81 0.90  CALCIUM 9.3  --    GFR: Estimated Creatinine Clearance: 99 mL/min (by C-G formula based on SCr of 0.9 mg/dL). Liver Function Tests:  Recent Labs Lab 10/12/17 1526  AST 24  ALT 31  ALKPHOS 63  BILITOT 1.1  PROT 7.5  ALBUMIN 4.4   No results for input(s): LIPASE, AMYLASE in the last 168 hours. No results for input(s): AMMONIA in the last 168 hours. Coagulation Profile:  Recent Labs Lab 10/12/17 1526  INR 0.99   Cardiac Enzymes: No results for input(s): CKTOTAL, CKMB, CKMBINDEX, TROPONINI in the last 168 hours. BNP (last 3 results) No results for input(s): PROBNP in the  last 8760 hours. HbA1C: No results for input(s): HGBA1C in the last 72 hours. CBG: No results for input(s): GLUCAP in the last 168 hours. Lipid Profile:  Recent Labs  10/13/17 0642  CHOL 152  HDL 34*  LDLCALC 89  TRIG 161  CHOLHDL 4.5   Thyroid Function Tests: No results for input(s): TSH, T4TOTAL, FREET4, T3FREE, THYROIDAB in the last 72 hours. Anemia Panel: No results for input(s): VITAMINB12, FOLATE, FERRITIN, TIBC, IRON, RETICCTPCT in the last 72 hours. Sepsis Labs: No results for input(s): PROCALCITON, LATICACIDVEN in the last 168 hours.  No results found for this or any previous visit (from the past 240 hour(s)).       Radiology Studies: Dg Chest 2 View  Result Date: 10/12/2017 CLINICAL DATA:  Right arm and leg weakness. Slurred speech. Possible stroke. EXAM: CHEST  2 VIEW COMPARISON:  01/06/2016 FINDINGS: The heart size and mediastinal contours are within normal limits. Both lungs are clear. The visualized skeletal structures are unremarkable. IMPRESSION: No active cardiopulmonary disease. Electronically Signed   By: Myles Rosenthal M.D.   On: 10/12/2017 17:22   Ct Head Wo Contrast  Result Date: 10/12/2017 CLINICAL DATA:  Focal neuro deficit greater than 6 hours. Suspect stroke EXAM: CT HEAD WITHOUT CONTRAST TECHNIQUE: Contiguous axial images were obtained from the base of the skull through the vertex without intravenous contrast. COMPARISON:  CT head report 01/03/2016 FINDINGS: Brain: No evidence of acute infarction, hemorrhage, hydrocephalus, extra-axial collection or mass lesion/mass effect. Vascular: No hyperdense vessel or unexpected calcification. Skull: Negative Sinuses/Orbits: Mucosal edema in the ethmoid sinuses. Remaining sinuses clear. Negative orbit. Other: None IMPRESSION: Negative CT head Electronically Signed   By: Marlan Palau M.D.   On: 10/12/2017 16:17   Mr Brain Wo Contrast  Result Date: 10/13/2017 CLINICAL DATA:  RIGHT-sided difficulty with aphasia.  This developed 10/12/2017. Symptoms are now resolving. EXAM: MRI HEAD WITHOUT CONTRAST TECHNIQUE: Multiplanar, multiecho pulse sequences of the brain and surrounding structures were obtained without intravenous contrast. COMPARISON:  CT head 10/12/2017. FINDINGS: A tiny metal fragment in the LEFT face contributes to some susceptibility artifact. The patient did not suffer any ill effects due to this. Brain: Subcentimeter area of restricted diffusion, corresponding low ADC, in the LEFT insula, representing acute infarction. Additional subcentimeter areas of restricted diffusion, LEFT posterior frontal cortex, superficial on axial imaging, more linear within the sulcus on coronal imaging, also extends along the convexity. See series 1, image 91, series 3, image 53. Possible third area of punctate diffusion abnormality, near the vertex, LEFT posterior frontal cortex, too small to correlate with ADC. See series 1, image 97. No hemorrhage, mass lesion, hydrocephalus, or extra-axial fluid. Normal for age cerebral volume. Mild subcortical and periventricular T2 and FLAIR hyperintensities, likely chronic microvascular ischemic change. Vascular: Flow voids are maintained throughout the carotid, basilar, and vertebral arteries. There are no areas of chronic hemorrhage. Skull and upper cervical spine: Unremarkable visualized calvarium, skullbase, and cervical vertebrae. Pituitary, pineal, cerebellar tonsils unremarkable. No upper cervical cord lesions. Sinuses/Orbits: Mild chronic sinus disease.  No orbital findings. Other: None. IMPRESSION: Multiple foci of restricted diffusion and over the LEFT hemisphere, at least two, possibly three, areas of the nonhemorrhagic acute infarction, LEFT MCA distribution. These could represent a shower of emboli or multiple thrombotic events. Normal for age cerebral volume with mild small vessel disease. Electronically Signed   By: Elsie Stain M.D.   On: 10/13/2017 12:08   Ct Abdomen  Pelvis W Contrast  Result Date: 10/12/2017 CLINICAL DATA:  Lower abdominal pain for several days. EXAM: CT ABDOMEN AND PELVIS WITH CONTRAST TECHNIQUE: Multidetector CT imaging of the abdomen and pelvis was performed using the standard protocol following bolus administration of intravenous contrast. CONTRAST:  ISOVUE-300 IOPAMIDOL (ISOVUE-300) INJECTION 61% COMPARISON:  None. FINDINGS: Lower Chest: No acute findings. Hepatobiliary: No hepatic masses identified. Cyst in the anterior right hepatic lobe noted. Gallbladder is unremarkable. No evidence of biliary ductal dilatation. Pancreas:  No mass or inflammatory changes. Spleen: Within normal limits in size and appearance. Adrenals/Urinary Tract: No masses identified. No evidence of hydronephrosis. 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 Stomach/Bowel: Mild diverticulitis involving distal descending colon. No evidence of abscess or bowel obstruction. Vascular/Lymphatic: No pathologically enlarged  lymph nodes. No abdominal aortic aneurysm. Aortic atherosclerosis. Reproductive:  No mass or other significant abnormality. Other:  None. Musculoskeletal:  No suspicious bone lesions identified. IMPRESSION: Mild diverticulitis involving distal descending colon. No evidence of abscess or other complication. Electronically Signed   By: Myles RosenthalJohn  Stahl M.D.   On: 10/12/2017 18:26   Mr Maxine GlennMra Head/brain ZOWo Cm  Result Date: 10/13/2017 CLINICAL DATA:  Multiple LEFT hemisphere strokes. Continued surveillance. EXAM: MRA HEAD WITHOUT CONTRAST TECHNIQUE: Angiographic images of the Circle of Willis were obtained using MRA technique without intravenous contrast. COMPARISON:  MRI brain without contrast performed concurrently and reported separately. FINDINGS: The RIGHT internal carotid artery demonstrates patency throughout its skull base and intracranial segments. Mild non stenotic irregularity in the proximal supraclinoid segment. LEFT internal  carotid artery demonstrates estimated 50% stenosis within the vertical petrous segment as well as mild non stenotic irregularity in the proximal supraclinoid segment. ICA termini widely patent. The proximal M1 segments, middle cerebral arteries are widely patent. The RIGHT anterior cerebral artery is dominant with both ACAs contributing. Patent anterior communicating artery. Moderate irregularity of the M2 and M3 branches bilaterally. Poor flow related enhancement of the M2 and M3 branches in the sylvian fissure consistent with intracranial atherosclerotic disease. This is particularly notable in the inferior division LEFT M2 MCA, series 107, image 23. Basilar artery widely patent with LEFT vertebral as the sole contributor. RIGHT vertebral contributes predominantly to PICA. No basilar stenosis, PCA irregularity, or cerebellar branch occlusion. RIGHT PICA poorly visualized. No visible saccular aneurysm. IMPRESSION: Intracranial atherosclerotic disease as described. These changes are most pronounced in the M2 and M3 segments bilaterally. No proximal flow-limiting stenosis of the internal carotid arteries, dominant vertebral artery, or basilar artery. Electronically Signed   By: Elsie StainJohn T Curnes M.D.   On: 10/13/2017 12:15        Scheduled Meds: .  stroke: mapping our early stages of recovery book   Does not apply Once  . aspirin EC  81 mg Oral Daily  . enoxaparin (LOVENOX) injection  40 mg Subcutaneous Q24H   Continuous Infusions: . ciprofloxacin Stopped (10/13/17 1106)  . metronidazole 500 mg (10/13/17 1231)     LOS: 0 days    Time spent: 30 minutes    Katrinka BlazingAlex U Kadolph, MD [facility] Pager [phone]  If 7PM-7AM, please contact night-coverage [email or web address] Password [email or web address] 10/13/2017, 12:31 PM

## 2017-10-14 ENCOUNTER — Observation Stay (HOSPITAL_COMMUNITY): Payer: Self-pay

## 2017-10-14 DIAGNOSIS — I63419 Cerebral infarction due to embolism of unspecified middle cerebral artery: Secondary | ICD-10-CM

## 2017-10-14 MED ORDER — CLOPIDOGREL BISULFATE 75 MG PO TABS: 75.0000 mg | ORAL_TABLET | Freq: Every day | ORAL | Status: DC

## 2017-10-14 MED ORDER — METRONIDAZOLE 500 MG PO TABS: 500.0000 mg | ORAL_TABLET | Freq: Three times a day (TID) | ORAL | 0 refills | Status: AC

## 2017-10-14 MED ORDER — ATORVASTATIN CALCIUM 40 MG PO TABS: 40.0000 mg | ORAL_TABLET | Freq: Every day | ORAL | Status: DC

## 2017-10-14 MED ORDER — CLOPIDOGREL BISULFATE 75 MG PO TABS: 75.0000 mg | ORAL_TABLET | Freq: Every day | ORAL | 0 refills | Status: DC

## 2017-10-14 MED ORDER — CIPROFLOXACIN HCL 500 MG PO TABS: 500.0000 mg | ORAL_TABLET | Freq: Two times a day (BID) | ORAL | 0 refills | Status: AC

## 2017-10-14 MED ORDER — ATORVASTATIN CALCIUM 40 MG PO TABS: 40.0000 mg | ORAL_TABLET | Freq: Every day | ORAL | 0 refills | Status: DC

## 2017-10-14 MED ORDER — ASPIRIN 325 MG PO TBEC
325.0000 mg | DELAYED_RELEASE_TABLET | Freq: Every day | ORAL | 0 refills | Status: DC
Start: 2017-10-14 — End: 2022-05-29

## 2017-10-14 NOTE — Discharge Instructions (Signed)
1. Establish care/ follow up with a PCP in the next 2-3 weeks 2. Follow up with neurology in 3-4 weeks 3. Take new medications as prescribed 4. Return to work on Thursday 10/25 5. Please obtain BMP/CBC in one week

## 2017-10-14 NOTE — Consult Note (Signed)
[facility] Richard Holz A. Gerilyn Pilgrimoonquah, MD     [email or web address]          Harold Guerrero is an 56 y.o. male.   ASSESSMENT/PLAN: 1.   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  The patient will be placed on dual antiplatelet agents for three months.  Subsequently a single agent is recommended.  Statin is also recommended.  Smoking cessation is also recommended.      The patient is a 56 year old right-handed white male who presents with acute focal neurological symptoms.  There is no significant past medical history and he was not on any medication we presented.  He developed the acute onset of weakness involving the right hand lasting for about 20 min.  This resolved and the later that day he developed significant weakness of the legs to the point that he almost fell.  This also lasted for about 20 min.  He has had disease medical attention when he had another episode this time with significant word-finding difficulties.  Word-finding difficulty lasted for about 2 hr.  He reports that is extremities have returned to normal and he reports no deficits at this time.  His speech has also return to normal today.  Again the speech impairment lasted for about two hr.  No headaches, dizziness, dysphagia or diplopia is reported.  Review systems is otherwise negative.   GENERAL:   This is a pleasant well-developed male in no acute distress.  HEENT:   This is normal.  ABDOMEN: soft  EXTREMITIES: No edema   BACK:  This is normal.  SKIN: Normal by inspection.    MENTAL STATUS: Alert and oriented. Speech, language and cognition are generally intact. Judgment and insight normal.   CRANIAL NERVES: Pupils are equal, round and reactive to light and accomodation; extra ocular movements are full, there is no significant nystagmus; visual fields are full; upper and lower facial muscles are normal in strength and symmetric, there is no flattening of  the nasolabial folds; tongue is midline; uvula is midline; shoulder elevation is normal.  MOTOR: Normal tone, bulk and strength; no pronator drift.  There is no drift of the upper extremities or lower extremities.  COORDINATION: Left finger to nose is normal, right finger to nose is normal, No rest tremor; no intention tremor; no postural tremor; no bradykinesia.  REFLEXES: Deep tendon reflexes are symmetrical and normal. Babinski reflexes are flexor bilaterally.   SENSATION: Normal to light touch, temperature, and pinprick.  There is no evidence of neglect.  GAIT: Normal.     NIH stroke scale 0.   Blood pressure (!) 154/85, pulse (!) 57, temperature 98.3 F (36.8 C), temperature source Oral, resp. rate 16, height 5\' 9"  (1.753 m), weight 187 lb 2.7 oz (84.9 kg), SpO2 98 %.  History reviewed. No pertinent past medical history.  History reviewed. No pertinent surgical history.  History reviewed. No pertinent family history.  Social History:  reports that he has been smoking Cigarettes.  He has been smoking about 0.50 packs per day. He has never used smokeless tobacco. He reports that he does not drink alcohol or use drugs.  Allergies: No Known Allergies  Medications: Prior to Admission medications   Medication Sig Start Date End Date Taking? Authorizing Provider  ibuprofen (ADVIL,MOTRIN) 200 MG tablet Take 600 mg by mouth every 6 (six) hours as needed for headache or moderate pain.   Yes [provider]  aspirin 325 MG EC tablet Take 1 tablet (  325 mg total) by mouth daily. 10/14/17   Filbert Schilder, MD  atorvastatin (LIPITOR) 40 MG tablet Take 1 tablet (40 mg total) by mouth daily at 6 PM. 10/14/17   Filbert Schilder, MD  ciprofloxacin (CIPRO) 500 MG tablet Take 1 tablet (500 mg total) by mouth 2 (two) times daily. 10/14/17 10/21/17  Filbert Schilder, MD  clopidogrel (PLAVIX) 75 MG tablet Take 1 tablet (75 mg total) by mouth daily. 10/14/17   Filbert Schilder, MD  metroNIDAZOLE (FLAGYL) 500 MG tablet Take 1 tablet (500 mg total) by mouth 3 (three) times daily. 10/14/17 10/21/17  Filbert Schilder, MD    Scheduled Meds: Continuous Infusions: PRN Meds:.     Results for orders placed or performed during the hospital encounter of 10/12/17 (from the past 48 hour(s))  Hemoglobin A1c     Status: None   Collection Time: 10/13/17  6:42 AM  Result Value Ref Range   Hgb A1c MFr Bld 5.4 4.8 - 5.6 %    Comment: (NOTE) Pre diabetes:          5.7%-6.4% Diabetes:              >6.4% Glycemic control for   <7.0% adults with diabetes    Mean Plasma Glucose 108.28 mg/dL    Comment: Performed at [facility], [address] [place], [place] [address]  Lipid panel     Status: Abnormal   Collection Time: 10/13/17  6:42 AM  Result Value Ref Range   Cholesterol 152 0 - 200 mg/dL   Triglycerides 540 <981 mg/dL   HDL 34 (L) >19 mg/dL   Total CHOL/HDL Ratio 4.5 RATIO   VLDL 29 0 - 40 mg/dL   LDL Cholesterol 89 0 - 99 mg/dL    Comment:        Total Cholesterol/HDL:CHD Risk Coronary Heart Disease Risk Table                     Men   Women  1/2 Average Risk   3.4   3.3  Average Risk       5.0   4.4  2 X Average Risk   9.6   7.1  3 X Average Risk  23.4   11.0        Use the calculated Patient Ratio above and the CHD Risk Table to determine the patient's CHD Risk.        ATP III CLASSIFICATION (LDL):  <100     mg/dL   Optimal  147-829  mg/dL   Near or Above                    Optimal  130-159  mg/dL   Borderline  562-130  mg/dL   High  >865     mg/dL   Very High   CBC     Status: Abnormal   Collection Time: 10/13/17  6:42 AM  Result Value Ref Range   WBC 11.0 (H) 4.0 - 10.5 K/uL   RBC 4.92 4.22 - 5.81 MIL/uL   Hemoglobin 15.6 13.0 - 17.0 g/dL   HCT 78.4 69.6 - 29.5 %   MCV 92.5 78.0 - 100.0 fL   MCH 31.7 26.0 - 34.0 pg   MCHC 34.3 30.0 - 36.0 g/dL   RDW 28.4 13.2 - 44.0 %   Platelets 249 150 - 400 K/uL     Studies/Results:  C spine MRI FINDINGS: Alignment: Reversal  of the normal cervical lordotic curve. Slight degenerative retrolisthesis at C5-6.  Vertebrae: No worrisome osseous lesion.  Cord: Mild cord flattening at C3-4, and C5-6, without abnormal cord signal.  Posterior Fossa, vertebral arteries, paraspinal tissues: Unremarkable.  Disc levels:  C2-3:  Normal.  C3-4: Annular bulge centrally effaces the anterior subarachnoid space, minimally flattening the ventral surface of the cord. There is no C4 foraminal narrowing.  C4-5:  Unremarkable.  C5-6: Disc space narrowing. Broad-based central protrusion/disc osteophyte complex, slight cord flattening. No significant stenosis. Canal diameter adequate at 9 mm. BILATERAL C6 foraminal narrowing, worse on the LEFT.  C6-7:  Normal.  C7-T1:  Mild facet arthropathy.  No disc protrusion.  IMPRESSION: Multilevel spondylosis, worst at C5-C6. Trace retrolisthesis, central disc pathology, and bony overgrowth contribute to BILATERAL C6 foraminal narrowing, worse on the LEFT. See discussion above.    MRA MRI BRAIN FINDINGS: The RIGHT internal carotid artery demonstrates patency throughout its skull base and intracranial segments. Mild non stenotic irregularity in the proximal supraclinoid segment.  LEFT internal carotid artery demonstrates estimated 50% stenosis within the vertical petrous segment as well as mild non stenotic irregularity in the proximal supraclinoid segment. ICA termini widely patent.  The proximal M1 segments, middle cerebral arteries are widely patent. The RIGHT anterior cerebral artery is dominant with both ACAs contributing. Patent anterior communicating artery.  Moderate irregularity of the M2 and M3 branches bilaterally. Poor flow related enhancement of the M2 and M3 branches in the sylvian fissure consistent with intracranial atherosclerotic disease. This is particularly notable  in the inferior division LEFT M2 MCA, series 107, image 23.  Basilar artery widely patent with LEFT vertebral as the sole contributor. RIGHT vertebral contributes predominantly to PICA. No basilar stenosis, PCA irregularity, or cerebellar branch occlusion. RIGHT PICA poorly visualized.  No visible saccular aneurysm.  IMPRESSION: Intracranial atherosclerotic disease as described. These changes are most pronounced in the M2 and M3 segments bilaterally. No proximal flow-limiting stenosis of the internal carotid arteries, dominant vertebral artery, or basilar artery.     FINDINGS: A tiny metal fragment in the LEFT face contributes to some susceptibility artifact. The patient did not suffer any ill effects due to this.  Brain: Subcentimeter area of restricted diffusion, corresponding low ADC, in the LEFT insula, representing acute infarction.  Additional subcentimeter areas of restricted diffusion, LEFT posterior frontal cortex, superficial on axial imaging, more linear within the sulcus on coronal imaging, also extends along the convexity. See series 1, image 91, series 3, image 53.  Possible third area of punctate diffusion abnormality, near the vertex, LEFT posterior frontal cortex, too small to correlate with ADC. See series 1, image 97.  No hemorrhage, mass lesion, hydrocephalus, or extra-axial fluid.  Normal for age cerebral volume. Mild subcortical and periventricular T2 and FLAIR hyperintensities, likely chronic microvascular ischemic change.  Vascular: Flow voids are maintained throughout the carotid, basilar, and vertebral arteries. There are no areas of chronic hemorrhage.  Skull and upper cervical spine: Unremarkable visualized calvarium, skullbase, and cervical vertebrae. Pituitary, pineal, cerebellar tonsils unremarkable. No upper cervical cord lesions.  Sinuses/Orbits: Mild chronic sinus disease.  No orbital findings.  Other:  None.  IMPRESSION: Multiple foci of restricted diffusion and over the LEFT hemisphere, at least two, possibly three, areas of the nonhemorrhagic acute infarction, LEFT MCA distribution. These could represent a shower of emboli or multiple thrombotic events.  Normal for age cerebral volume with mild small vessel disease.   CAROTID DOPPLERS FINE      The brain  MRI is reviewed in person.  There is a area of increased signal seen on DWI vomiting the insular cortex on the left side.  This is seen on to contact.  The two other small lesions more posterior involving the left frontal lobe.  No hemorrhage is appreciated.  MRA shows significant bilateral atherosclerotic disease involving the MCA.  Most notably the left side shows dropout with reconstitution indicating marked MCA stenosis.  Posterior circulation looks fine.   Kaylin Marcon A. Gerilyn Pilgrim, M.D.  Diplomate, Biomedical engineer of Psychiatry and Neurology ( Neurology). 10/14/2017, 11:35 PM

## 2017-10-14 NOTE — Care Management Note (Signed)
Case Management Note  Patient Details  Name: Harold Guerrero MRN: [id number] Date of Birth: 1961/09/26  Subjective/Objective:            Admitted for CVA and UTI. Pt is [place] home, [place], employed FT, uninsured, has no PCP.         Action/Plan: Will f/u with neuro. [place]. Pt has resources for Rx, he was given Good Rx coupons and a comparison of prices at different pharmacies, giving lowest prices. Pt requests Rx be faxed to [facility].   Expected Discharge Date:  10/13/17               Expected Discharge Plan:  Home/Self Care  In-House Referral:  NA  Discharge planning Services  CM Consult, Medication Assistance  Post Acute Care Choice:  NA Choice offered to:  NA  Status of Service:  Completed, signed off   Malcolm MetroChildress, Harold Hack Demske, RN 10/14/2017, 12:43 PM

## 2017-10-14 NOTE — Progress Notes (Signed)
Discharge instructions given, verbalized understanding, out in stable condition ambulatory. 

## 2017-10-14 NOTE — Progress Notes (Signed)
OT Screen Note  Patient Details Name: Harold Guerrero MRN: [id number] DOB: 1961/09/02   Cancelled Treatment:    Reason Eval/Treat Not Completed: OT screened, no needs identified, will sign off  Limmie PatriciaLaura Rosangelica Pevehouse, OTR/L,CBIS  [phone]   10/14/2017, 10:25 AM

## 2017-10-14 NOTE — Discharge Summary (Signed)
Physician Discharge Summary  Harold Guerrero WUJ:[id number] DOB: [birth year] DOA: 10/12/2017  PCP: Patient, No Pcp Per  Admit date: 10/12/2017 Discharge date: 10/14/2017  Admitted From: Home  Disposition:  Home   Recommendations for Outpatient Follow-up:  1. Establish care/ follow up with a PCP in the next 2-3 weeks 2. Follow up with neurology in 3-4 weeks 3. Take new medications as prescribed 4. Return to work on Thursday 10/25 5. Please obtain BMP/CBC in one week  Home Health: No Equipment/Devices: None   Discharge Condition:Stable CODE STATUS: Full code  Diet recommendation: Heart Healthy  Brief/Interim Summary: Harold Dredgedward Craddockis a 56 y.o.malewho is otherwise healthy. Patient seen for weakness and difficulty speaking that occurred earlier today. Earlier this morning, the patient had difficulty grasping his coffee cup with his right hand and was unable to lift it. He attempted to put his coffee in the microwave was having difficulty coordinating his movements to get the cup in the microwave. After resting for a little bit, the patient found his movements improved, but not back to baseline. Additionally, the patient had attempted to walk after resting and both of the patient's legs gave out and he fell on the floor. When he attempted to call his boss, the patient had an episode of aphasia -he knew what he wanted to say, however all of his words do not make sense. After this, the patient talked with his daughter and he was brought to the hospital for evaluation.  Additionally, the patient has been complaining of abdominal pain and loss of appetite over the past 2 days. Patient had superior Wednesday evening and since then has had abdominal pain. Eating has worsened his pain. He has had very little to eat. No other palliating or provoking factors. No blood in his stool. Denies diarrhea, constipation.  Emergency Department Course: Patient's neurological symptoms have been  improving since arrival at the hospital -initially he was noted to have slurred speech with little movement of his right corner of his mouth. However this has improved greatly and he appears to be talking normally. CT of the head was negative. CT abdomen shows diverticulitis  Patient was admitted and MRI/MRA done showing shower embolic strokes.  He was started on an aspirin and plavix.  Echocardiogram performed as was carotid ultrasound- both unrevealing for cause of emboli.  He was transitioned to PO antibiotics for diverticulitis and was stable for discharge on antibiotics, plavix, aspirin, statin on 10/22.  He was instructed to follow up with neurology in 3-4 weeks and to see a primary care physician in the next 1-2 weeks.  Discharge Diagnoses:  Principal Problem:   Aphasia Active Problems:   Diverticulitis   Right arm weakness   Essential hypertension   Heart murmur    Discharge Instructions   Allergies as of 10/14/2017   No Known Allergies     Medication List    TAKE these medications   aspirin 325 MG EC tablet Take 1 tablet (325 mg total) by mouth daily.   atorvastatin 40 MG tablet Commonly known as:  LIPITOR Take 1 tablet (40 mg total) by mouth daily at 6 PM.   ciprofloxacin 500 MG tablet Commonly known as:  CIPRO Take 1 tablet (500 mg total) by mouth 2 (two) times daily.   clopidogrel 75 MG tablet Commonly known as:  PLAVIX Take 1 tablet (75 mg total) by mouth daily.   ibuprofen 200 MG tablet Commonly known as:  ADVIL,MOTRIN Take 600 mg by mouth every 6 (six) hours as  needed for headache or moderate pain.   metroNIDAZOLE 500 MG tablet Commonly known as:  FLAGYL Take 1 tablet (500 mg total) by mouth 3 (three) times daily.      Follow-up Information    Beryle Beams, MD. Schedule an appointment as soon as possible for a visit in 3 week(s).   Specialty:  Neurology Contact information: [address] [place] [address] [phone]           No Known Allergies  Consultations:  Neurology   Procedures/Studies: Dg Chest 2 View  Result Date: 10/12/2017 CLINICAL DATA:  Right arm and leg weakness. Slurred speech. Possible stroke. EXAM: CHEST  2 VIEW COMPARISON:  01/06/2016 FINDINGS: The heart size and mediastinal contours are within normal limits. Both lungs are clear. The visualized skeletal structures are unremarkable. IMPRESSION: No active cardiopulmonary disease. Electronically Signed   By: Myles Rosenthal M.D.   On: 10/12/2017 17:22   Ct Head Wo Contrast  Result Date: 10/12/2017 CLINICAL DATA:  Focal neuro deficit greater than 6 hours. Suspect stroke EXAM: CT HEAD WITHOUT CONTRAST TECHNIQUE: Contiguous axial images were obtained from the base of the skull through the vertex without intravenous contrast. COMPARISON:  CT head report 01/03/2016 FINDINGS: Brain: No evidence of acute infarction, hemorrhage, hydrocephalus, extra-axial collection or mass lesion/mass effect. Vascular: No hyperdense vessel or unexpected calcification. Skull: Negative Sinuses/Orbits: Mucosal edema in the ethmoid sinuses. Remaining sinuses clear. Negative orbit. Other: None IMPRESSION: Negative CT head Electronically Signed   By: Marlan Palau M.D.   On: 10/12/2017 16:17   Mr Brain Wo Contrast  Result Date: 10/13/2017 CLINICAL DATA:  RIGHT-sided difficulty with aphasia. This developed 10/12/2017. Symptoms are now resolving. EXAM: MRI HEAD WITHOUT CONTRAST TECHNIQUE: Multiplanar, multiecho pulse sequences of the brain and surrounding structures were obtained without intravenous contrast. COMPARISON:  CT head 10/12/2017. FINDINGS: A tiny metal fragment in the LEFT face contributes to some susceptibility artifact. The patient did not suffer any ill effects due to this. Brain: Subcentimeter area of restricted diffusion, corresponding low ADC, in the LEFT insula, representing acute infarction. Additional subcentimeter areas of restricted diffusion, LEFT posterior  frontal cortex, superficial on axial imaging, more linear within the sulcus on coronal imaging, also extends along the convexity. See series 1, image 91, series 3, image 53. Possible third area of punctate diffusion abnormality, near the vertex, LEFT posterior frontal cortex, too small to correlate with ADC. See series 1, image 97. No hemorrhage, mass lesion, hydrocephalus, or extra-axial fluid. Normal for age cerebral volume. Mild subcortical and periventricular T2 and FLAIR hyperintensities, likely chronic microvascular ischemic change. Vascular: Flow voids are maintained throughout the carotid, basilar, and vertebral arteries. There are no areas of chronic hemorrhage. Skull and upper cervical spine: Unremarkable visualized calvarium, skullbase, and cervical vertebrae. Pituitary, pineal, cerebellar tonsils unremarkable. No upper cervical cord lesions. Sinuses/Orbits: Mild chronic sinus disease.  No orbital findings. Other: None. IMPRESSION: Multiple foci of restricted diffusion and over the LEFT hemisphere, at least two, possibly three, areas of the nonhemorrhagic acute infarction, LEFT MCA distribution. These could represent a shower of emboli or multiple thrombotic events. Normal for age cerebral volume with mild small vessel disease. Electronically Signed   By: Elsie Stain M.D.   On: 10/13/2017 12:08   Mr Cervical Spine Wo Contrast  Result Date: 10/13/2017 CLINICAL DATA:  RIGHT sided weakness. This began 10/12/2017. Symptoms have largely resolved. MRI brain shows acute LEFT hemisphere strokes. EXAM: MRI CERVICAL SPINE WITHOUT CONTRAST TECHNIQUE: Multiplanar, multisequence MR imaging of the  cervical spine was performed. No intravenous contrast was administered. COMPARISON:  MR brain reported separately. FINDINGS: Alignment: Reversal of the normal cervical lordotic curve. Slight degenerative retrolisthesis at C5-6. Vertebrae: No worrisome osseous lesion. Cord: Mild cord flattening at C3-4, and C5-6,  without abnormal cord signal. Posterior Fossa, vertebral arteries, paraspinal tissues: Unremarkable. Disc levels: C2-3:  Normal. C3-4: Annular bulge centrally effaces the anterior subarachnoid space, minimally flattening the ventral surface of the cord. There is no C4 foraminal narrowing. C4-5:  Unremarkable. C5-6: Disc space narrowing. Broad-based central protrusion/disc osteophyte complex, slight cord flattening. No significant stenosis. Canal diameter adequate at 9 mm. BILATERAL C6 foraminal narrowing, worse on the LEFT. C6-7:  Normal. C7-T1:  Mild facet arthropathy.  No disc protrusion. IMPRESSION: Multilevel spondylosis, worst at C5-C6. Trace retrolisthesis, central disc pathology, and bony overgrowth contribute to BILATERAL C6 foraminal narrowing, worse on the LEFT. See discussion above. Electronically Signed   By: Elsie Stain M.D.   On: 10/13/2017 12:43   Ct Abdomen Pelvis W Contrast  Result Date: 10/12/2017 CLINICAL DATA:  Lower abdominal pain for several days. EXAM: CT ABDOMEN AND PELVIS WITH CONTRAST TECHNIQUE: Multidetector CT imaging of the abdomen and pelvis was performed using the standard protocol following bolus administration of intravenous contrast. CONTRAST:  ISOVUE-300 IOPAMIDOL (ISOVUE-300) INJECTION 61% COMPARISON:  None. FINDINGS: Lower Chest: No acute findings. Hepatobiliary: No hepatic masses identified. Cyst in the anterior right hepatic lobe noted. Gallbladder is unremarkable. No evidence of biliary ductal dilatation. Pancreas:  No mass or inflammatory changes. Spleen: Within normal limits in size and appearance. Adrenals/Urinary Tract: No masses identified. No evidence of hydronephrosis. 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 Stomach/Bowel: Mild diverticulitis involving distal descending colon. No evidence of abscess or bowel obstruction. Vascular/Lymphatic: No pathologically enlarged lymph nodes. No abdominal aortic aneurysm. Aortic  atherosclerosis. Reproductive:  No mass or other significant abnormality. Other:  None. Musculoskeletal:  No suspicious bone lesions identified. IMPRESSION: Mild diverticulitis involving distal descending colon. No evidence of abscess or other complication. Electronically Signed   By: Myles Rosenthal M.D.   On: 10/12/2017 18:26   US Carotid Bilateral  Result Date: 10/14/2017 CLINICAL DATA:  Cerebral infarction. History of hypertension and smoking. EXAM: BILATERAL CAROTID DUPLEX ULTRASOUND TECHNIQUE: Wallace Cullens scale imaging, color Doppler and duplex ultrasound were performed of bilateral carotid and vertebral arteries in the neck. COMPARISON:  None. FINDINGS: Criteria: Quantification of carotid stenosis is based on velocity parameters that correlate the residual internal carotid diameter with NASCET-based stenosis levels, using the diameter of the distal internal carotid lumen as the denominator for stenosis measurement. The following velocity measurements were obtained: RIGHT ICA:  84/25 cm/sec CCA:  118/23 cm/sec SYSTOLIC ICA/CCA RATIO:  0.7 DIASTOLIC ICA/CCA RATIO:  1.1 ECA:  145 cm/sec LEFT ICA:  105/31 cm/sec CCA:  85/15 cm/sec SYSTOLIC ICA/CCA RATIO:  1.2 DIASTOLIC ICA/CCA RATIO:  2.1 ECA:  112 cm/sec RIGHT CAROTID ARTERY: There is some intimal thickening in the distal common carotid artery and carotid bulb. Minimal partially calcified plaque is present in the proximal internal carotid artery. Velocities and waveforms are normal and estimated right ICA stenosis is less than 50%. RIGHT VERTEBRAL ARTERY: Antegrade flow with normal waveform and velocity. LEFT CAROTID ARTERY: There is a mild amount of predominately calcified plaque at the level of the distal bulb and extending to the ICA origin. Estimated left ICA stenosis is less than 50%. LEFT VERTEBRAL ARTERY: Antegrade flow with normal waveform and velocity. IMPRESSION: No evidence of significant carotid stenosis in  the neck. Estimated bilateral ICA stenoses are  less than 50%. There is a mild amount of plaque at the level of the left carotid bulb and proximal ICA. Minimal plaque is present in the proximal right ICA. Electronically Signed   By: Irish Lack M.D.   On: 10/14/2017 09:54   Mr Maxine Glenn Head/brain WU Cm  Result Date: 10/13/2017 CLINICAL DATA:  Multiple LEFT hemisphere strokes. Continued surveillance. EXAM: MRA HEAD WITHOUT CONTRAST TECHNIQUE: Angiographic images of the Circle of Willis were obtained using MRA technique without intravenous contrast. COMPARISON:  MRI brain without contrast performed concurrently and reported separately. FINDINGS: The RIGHT internal carotid artery demonstrates patency throughout its skull base and intracranial segments. Mild non stenotic irregularity in the proximal supraclinoid segment. LEFT internal carotid artery demonstrates estimated 50% stenosis within the vertical petrous segment as well as mild non stenotic irregularity in the proximal supraclinoid segment. ICA termini widely patent. The proximal M1 segments, middle cerebral arteries are widely patent. The RIGHT anterior cerebral artery is dominant with both ACAs contributing. Patent anterior communicating artery. Moderate irregularity of the M2 and M3 branches bilaterally. Poor flow related enhancement of the M2 and M3 branches in the sylvian fissure consistent with intracranial atherosclerotic disease. This is particularly notable in the inferior division LEFT M2 MCA, series 107, image 23. Basilar artery widely patent with LEFT vertebral as the sole contributor. RIGHT vertebral contributes predominantly to PICA. No basilar stenosis, PCA irregularity, or cerebellar branch occlusion. RIGHT PICA poorly visualized. No visible saccular aneurysm. IMPRESSION: Intracranial atherosclerotic disease as described. These changes are most pronounced in the M2 and M3 segments bilaterally. No proximal flow-limiting stenosis of the internal carotid arteries, dominant vertebral artery,  or basilar artery. Electronically Signed   By: Elsie Stain M.D.   On: 10/13/2017 12:15   Echo:  Moderate LVH with LVEF 55-60%. Indeterminate diastolic function.   Trivial mitral and tricuspid regurgitation.   Subjective: Patient  Feeling well.  Tolerated his diet without concern.  No nausea, cramping, diarrhea.  Weakness resolved.    Discharge Exam: Vitals:   10/14/17 0230 10/14/17 0642  BP: 130/78 140/76  Pulse: (!) 56 (!) 56  Resp: 18 20  Temp: 98.3 F (36.8 C) 98 F (36.7 C)  SpO2: 97% 99%   Vitals:   10/13/17 2030 10/13/17 2230 10/14/17 0230 10/14/17 0642  BP: (!) 161/73 139/68 130/78 140/76  Pulse: 63 (!) 56 (!) 56 (!) 56  Resp: 20 20 18 20   Temp: 98.4 F (36.9 C) 99.1 F (37.3 C) 98.3 F (36.8 C) 98 F (36.7 C)  TempSrc: Oral Oral Oral   SpO2: 98% 99% 97% 99%  Weight:      Height:        General: Pt is alert, awake, not in acute distress Cardiovascular: RRR, S1/S2 +, no rubs, no gallops Respiratory: CTA bilaterally, no wheezing, no rhonchi Abdominal: Soft, NT, ND, bowel sounds + Extremities: no edema, no cyanosis    The results of significant diagnostics from this hospitalization (including imaging, microbiology, ancillary and laboratory) are listed below for reference.     Microbiology: No results found for this or any previous visit (from the past 240 hour(s)).   Labs: BNP (last 3 results) No results for input(s): BNP in the last 8760 hours. Basic Metabolic Panel:  Recent Labs Lab 10/12/17 1526 10/12/17 1537  NA 139 141  K 3.8 3.9  CL 104 102  CO2 25  --   GLUCOSE 119* 114*  BUN 9 7  CREATININE  0.81 0.90  CALCIUM 9.3  --    Liver Function Tests:  Recent Labs Lab 10/12/17 1526  AST 24  ALT 31  ALKPHOS 63  BILITOT 1.1  PROT 7.5  ALBUMIN 4.4   No results for input(s): LIPASE, AMYLASE in the last 168 hours. No results for input(s): AMMONIA in the last 168 hours. CBC:  Recent Labs Lab 10/12/17 1526 10/12/17 1537  10/13/17 0642  WBC 14.0*  --  11.0*  NEUTROABS 8.8*  --   --   HGB 16.4 16.3 15.6  HCT 46.8 48.0 45.5  MCV 92.7  --  92.5  PLT 248  --  249   Cardiac Enzymes: No results for input(s): CKTOTAL, CKMB, CKMBINDEX, TROPONINI in the last 168 hours. BNP: Invalid input(s): POCBNP CBG: No results for input(s): GLUCAP in the last 168 hours. D-Dimer No results for input(s): DDIMER in the last 72 hours. Hgb A1c  Recent Labs  10/13/17 0642  HGBA1C 5.4   Lipid Profile  Recent Labs  10/13/17 0642  CHOL 152  HDL 34*  LDLCALC 89  TRIG 161  CHOLHDL 4.5   Thyroid function studies No results for input(s): TSH, T4TOTAL, T3FREE, THYROIDAB in the last 72 hours.  Invalid input(s): FREET3 Anemia work up No results for input(s): VITAMINB12, FOLATE, FERRITIN, TIBC, IRON, RETICCTPCT in the last 72 hours. Urinalysis    Component Value Date/Time   COLORURINE YELLOW 10/12/2017 1526   APPEARANCEUR CLEAR 10/12/2017 1526   LABSPEC 1.011 10/12/2017 1526   PHURINE 7.0 10/12/2017 1526   GLUCOSEU NEGATIVE 10/12/2017 1526   HGBUR SMALL (A) 10/12/2017 1526   BILIRUBINUR NEGATIVE 10/12/2017 1526   KETONESUR NEGATIVE 10/12/2017 1526   PROTEINUR NEGATIVE 10/12/2017 1526   NITRITE NEGATIVE 10/12/2017 1526   LEUKOCYTESUR NEGATIVE 10/12/2017 1526   Sepsis Labs Invalid input(s): PROCALCITONIN,  WBC,  LACTICIDVEN Microbiology No results found for this or any previous visit (from the past 240 hour(s)).   Time coordinating discharge: 30 minutes  SIGNED:   Katrinka Blazing, MD  [facility] 10/14/2017, 10:28 AM Pager [phone] If 7PM-7AM, please contact night-coverage [email or web address] Password [email or web address]

## 2021-08-10 ENCOUNTER — Encounter: Payer: Self-pay | Admitting: Orthopaedic Surgery

## 2021-08-10 ENCOUNTER — Ambulatory Visit (INDEPENDENT_AMBULATORY_CARE_PROVIDER_SITE_OTHER): Payer: BC Managed Care – PPO | Admitting: Orthopaedic Surgery

## 2021-08-10 ENCOUNTER — Other Ambulatory Visit: Payer: Self-pay

## 2021-08-10 VITALS — BP 147/81 | HR 57 | Ht 70.0 in | Wt 192.0 lb

## 2021-08-10 DIAGNOSIS — M25572 Pain in left ankle and joints of left foot: Secondary | ICD-10-CM | POA: Diagnosis not present

## 2021-08-10 DIAGNOSIS — M25571 Pain in right ankle and joints of right foot: Secondary | ICD-10-CM | POA: Diagnosis not present

## 2021-08-10 DIAGNOSIS — G8929 Other chronic pain: Secondary | ICD-10-CM | POA: Diagnosis not present

## 2021-08-10 NOTE — Progress Notes (Signed)
Office Visit Note   Patient: Harold Guerrero           Date of Birth: 1961/02/13           MRN: [id number] Visit Date: 08/10/2021              Requested by: No referring provider defined for this encounter. PCP: Kirstie Peri, MD   Assessment & Plan: Visit Diagnoses:  1. Chronic pain of both ankles     Plan: We discussed trying piece of duct tape directly over the skin stop friction.  Then white sock followed by thicker hiking sock.  X-rays reviewed which shows calcification medial and lateral in the right ankle consistent with old ligamentous injuries.  Left ankle x-rays are normal.  He can try a small felt donut over the medial malleolus of the left ankle as well.  Follow-Up Instructions: No follow-ups on file.   Orders:  Orders Placed This Encounter  Procedures   XR Ankle Complete Right   XR Ankle Complete Left   No orders of the defined types were placed in this encounter.     Procedures: No procedures performed   Clinical Data: No additional findings.   Subjective: Chief Complaint  Patient presents with   Right Ankle - Pain   Left Ankle - Pain    HPI 60-year-old male with problems with tenderness over his left medial malleolus and right lateral malleolus.  Past history of ankle sprains in the last several years.  He is wearing crocs and states whenever he wears his normal hightop Nike's he has problems with pain directly over the medial malleolus.  No skin breakdown no erythema.  He is tried some Advil which gave him slight relief.  He is try double socks.  He states some low top shoes rub up in the area at times also.  Normally wears hightop tennis shoes within uses them with laces lax.  Review of Systems all other systems are noncontributory to HPI.   Objective: Vital Signs: BP (!) 147/81   Pulse (!) 57   Ht 5\' 10"  (1.778 m)   Wt 192 lb (87.1 kg)   BMI 27.55 kg/m   Physical Exam Constitutional:      Appearance: He is well-developed.  HENT:      Head: Normocephalic and atraumatic.     Right Ear: External ear normal.     Left Ear: External ear normal.  Eyes:     Pupils: Pupils are equal, round, and reactive to light.  Neck:     Thyroid: No thyromegaly.     Trachea: No tracheal deviation.  Cardiovascular:     Rate and Rhythm: Normal rate.  Pulmonary:     Effort: Pulmonary effort is normal.     Breath sounds: No wheezing.  Abdominal:     General: Bowel sounds are normal.     Palpations: Abdomen is soft.  Musculoskeletal:     Cervical back: Neck supple.  Skin:    General: Skin is warm and dry.     Capillary Refill: Capillary refill takes less than 2 seconds.  Neurological:     Mental Status: He is alert and oriented to person, place, and time.  Psychiatric:        Behavior: Behavior normal.        Thought Content: Thought content normal.        Judgment: Judgment normal.    Ortho Exam patient has a right elastic compressive knee sleeve.  Negative anterior drawer right  left ankle.  Some palpable calcification of the deltoid ligament right ankle.  He has point tenderness over the medial malleolus of the left ankle and lateral malleolus of the right ankle.  No skin breakdown peroneals posterior tib anterior tib and Achilles are all normal to palpation and exam.  Normal heel toe walking.  Specialty Comments:  No specialty comments available.  Imaging: XR Ankle Complete Left  Result Date: 08/10/2021 Standing AP both ankles lateral left ankle obtained and reviewed.  This shows normal alignment.  No acute or chronic degenerative changes present. Impression:: Normal left ankle radiographs  XR Ankle Complete Right  Result Date: 08/10/2021 AP ankle x-rays and lateral right ankle obtained and reviewed.  This shows calcification in the deltoid ligament.  Lateral spurs consistent with old ligamentous injury and mild lateral talar tilt. Impression: Ankle x-rays consistent with previous ankle ligamentous injuries.  No acute changes.     PMFS History: Patient Active Problem List   Diagnosis Date Noted   Diverticulitis 10/12/2017   Aphasia 10/12/2017   Right arm weakness 10/12/2017   Essential hypertension 10/12/2017   Heart murmur 10/12/2017   No past medical history on file.  No family history on file.  No past surgical history on file. Social History   Occupational History   Not on file  Tobacco Use   Smoking status: Every Day    Packs/day: 0.50    Types: Cigarettes   Smokeless tobacco: Never  Substance and Sexual Activity   Alcohol use: No   Drug use: No   Sexual activity: Not on file

## 2021-09-05 ENCOUNTER — Encounter: Payer: Self-pay | Admitting: Internal Medicine

## 2021-11-15 ENCOUNTER — Encounter: Payer: Self-pay | Admitting: Orthopaedic Surgery

## 2021-11-15 ENCOUNTER — Ambulatory Visit: Payer: BC Managed Care – PPO | Admitting: Orthopaedic Surgery

## 2021-11-15 DIAGNOSIS — M25562 Pain in left knee: Secondary | ICD-10-CM | POA: Diagnosis not present

## 2021-11-15 DIAGNOSIS — F17210 Nicotine dependence, cigarettes, uncomplicated: Secondary | ICD-10-CM

## 2021-11-15 DIAGNOSIS — G8929 Other chronic pain: Secondary | ICD-10-CM | POA: Diagnosis not present

## 2021-11-15 NOTE — Patient Instructions (Signed)

## 2021-11-15 NOTE — Progress Notes (Signed)
My left knee hurts.  He has had pain in the left knee for about six months.  It is getting worse.  He has swelling and popping but no giving way.  He has seen Dr. Ophelia Charter in the past and asked to be seen here today because Dr. Ophelia Charter is usually here on Thursday but tomorrow is Thanksgiving.  He cannot take NSAIDs secondary to GI problems.  He was recently seen by GI at [facility] and had a procedure.  His knee has no redness, no numbness.  He denies any trauma.  He has pain at night and the pain is more medial.    He has taken Tylenol and used ice with limited help.  Exam shows ROM of the left knee is 0 to 115, slight effusion and crepitus, pain medially, weakly positive medial McMurray, limp to the left.  NV intact.  No distal edema.  I have deferred any X-rays today.  Encounter Diagnoses  Name Primary?   Chronic pain of left knee Yes   Nicotine dependence, cigarettes, uncomplicated     I have told him I am concerned about tear of the meniscus of the left knee.    He says he comes in today for an injection as he had had injections in the past to the shoulder by a doctor in [place] years ago that really helped.  PROCEDURE NOTE:  The patient requests injection of the left knee , verbal consent was obtained.  The left knee was prepped appropriately after time out was performed.   Sterile technique was observed and injection of 1 cc of DepoMedrol 40 mg with several cc's of plain xylocaine. Anesthesia was provided by ethyl chloride and a 20-gauge needle was used to inject the knee area. The injection was tolerated well.  A band aid dressing was applied.  The patient was advised to apply ice later today and tomorrow to the injection sight as needed.   See Dr. Ophelia Charter next week.  Consider MRI if not improved.  Patient is agreeable to this.  Call if any problem.  Precautions discussed.  Electronically Signed Darreld Mclean, MD 11/23/20229:20 AM

## 2022-01-17 ENCOUNTER — Ambulatory Visit: Payer: BC Managed Care – PPO | Admitting: Internal Medicine

## 2022-01-17 ENCOUNTER — Ambulatory Visit: Payer: BC Managed Care – PPO | Admitting: Gastroenterology

## 2022-05-28 ENCOUNTER — Encounter: Payer: Self-pay | Admitting: *Deleted

## 2022-05-29 ENCOUNTER — Encounter: Payer: Self-pay | Admitting: Cardiology

## 2022-05-29 ENCOUNTER — Ambulatory Visit: Payer: BC Managed Care – PPO | Admitting: Cardiology

## 2022-05-29 ENCOUNTER — Telehealth: Payer: Self-pay | Admitting: Cardiology

## 2022-05-29 ENCOUNTER — Encounter: Payer: Self-pay | Admitting: *Deleted

## 2022-05-29 VITALS — BP 140/78 | HR 75 | Ht 70.0 in | Wt 190.6 lb

## 2022-05-29 DIAGNOSIS — Z8673 Personal history of transient ischemic attack (TIA), and cerebral infarction without residual deficits: Secondary | ICD-10-CM

## 2022-05-29 DIAGNOSIS — R600 Localized edema: Secondary | ICD-10-CM | POA: Diagnosis not present

## 2022-05-29 DIAGNOSIS — I10 Essential (primary) hypertension: Secondary | ICD-10-CM

## 2022-05-29 DIAGNOSIS — R0789 Other chest pain: Secondary | ICD-10-CM

## 2022-05-29 MED ORDER — ROSUVASTATIN CALCIUM 5 MG PO TABS
5.0000 mg | ORAL_TABLET | Freq: Every day | ORAL | 6 refills | Status: AC
Start: 2022-05-29 — End: ?

## 2022-05-29 MED ORDER — AMLODIPINE BESYLATE 10 MG PO TABS: 10.0000 mg | ORAL_TABLET | Freq: Every day | ORAL | 6 refills | Status: AC

## 2022-05-29 MED ORDER — ASPIRIN 81 MG PO TBEC
81.0000 mg | DELAYED_RELEASE_TABLET | Freq: Every day | ORAL | Status: AC
Start: 2022-05-29 — End: ?

## 2022-05-29 MED ORDER — FUROSEMIDE 20 MG PO TABS: 20.0000 mg | ORAL_TABLET | ORAL | 2 refills | Status: DC | PRN

## 2022-05-29 NOTE — Addendum Note (Signed)
Addended by: Lesle Chris on: 05/29/2022 02:08 PM   Modules accepted: Orders

## 2022-05-29 NOTE — Progress Notes (Signed)
Clinical Summary Harold Guerrero is a 61 y.o.male seen today as a new consult, referred by Harold Guerrero for the following medical problems.  1.Leg edema - started few months, off and on - bilateral swelling. Some dizziness around that time. No SOB/DOE. No orthopnea - did not start diuretic - has been on norvasc about 5 years 2018 echo: LVEF 55-60%, indet diastolic - 2 episodes, lasted few a days   2.HTN - compliant with meds - does not check at home.    3. Chest pains/pressure - started about 2 years ago - squeezing like pain, worst with deep breathing. Midchest, 5/10 in severity. Often occurs at work, typically with stress. Does heavy yardwork without associated symptoms. - lasts a few minutes at a time. 2-3 times a week. Can radiate down left arm. No other associated symptoms.  CAD risk factors: prior CVA, history of tobacco abuse, HTN   4. History of CVA - about 6 years - has stopped taking his statin  5.Hyperlipidemia - nausea on atorva and dizziness - he thinks also tried pravastatin with side effects    SH: automobile tech      Past Medical History:  Diagnosis Date   Bilateral edema of lower extremity    Cigarette nicotine dependence    Diverticulosis    Dizziness    Fatigue    GERD (gastroesophageal reflux disease)    History of CVA (cerebrovascular accident)    History of Rocky Mountain spotted fever    Hypertension    Insomnia    Restless leg syndrome    Smoking    Tinea corporis      Allergies  Allergen Reactions   Plavix [Clopidogrel]      Current Outpatient Medications  Medication Sig Dispense Refill   amLODipine (NORVASC) 5 MG tablet Take 5 mg by mouth daily.     aspirin 325 MG EC tablet Take 1 tablet (325 mg total) by mouth daily. 30 tablet 0   atorvastatin (LIPITOR) 40 MG tablet Take 1 tablet (40 mg total) by mouth daily at 6 PM. 30 tablet 0   cetirizine (ZYRTEC) 10 MG tablet Take 10 mg by mouth daily.     clopidogrel (PLAVIX) 75  MG tablet Take 1 tablet (75 mg total) by mouth daily. 30 tablet 0   ibuprofen (ADVIL,MOTRIN) 200 MG tablet Take 600 mg by mouth every 6 (six) hours as needed for headache or moderate pain.     meloxicam (MOBIC) 15 MG tablet Take 15 mg by mouth daily.     methocarbamol (ROBAXIN) 500 MG tablet Take 500 mg by mouth at bedtime.     omeprazole (PRILOSEC) 20 MG capsule Take 20 mg by mouth daily.     No current facility-administered medications for this visit.     No past surgical history on file.   Allergies  Allergen Reactions   Plavix [Clopidogrel]       No family history on file.   Social History Harold Guerrero reports that he has been smoking cigarettes. He has been smoking an average of .5 packs per day. He has never used smokeless tobacco. Harold Guerrero reports no history of alcohol use.   Review of Systems CONSTITUTIONAL: No weight loss, fever, chills, weakness or fatigue.  HEENT: Eyes: No visual loss, blurred vision, double vision or yellow sclerae.No hearing loss, sneezing, congestion, runny nose or sore throat.  SKIN: No rash or itching.  CARDIOVASCULAR: per hpi RESPIRATORY: No shortness of breath, cough or sputum.  GASTROINTESTINAL:  No anorexia, nausea, vomiting or diarrhea. No abdominal pain or blood.  GENITOURINARY: No burning on urination, no polyuria NEUROLOGICAL: No headache, dizziness, syncope, paralysis, ataxia, numbness or tingling in the extremities. No change in bowel or bladder control.  MUSCULOSKELETAL: No muscle, back pain, joint pain or stiffness.  LYMPHATICS: No enlarged nodes. No history of splenectomy.  PSYCHIATRIC: No history of depression or anxiety.  ENDOCRINOLOGIC: No reports of sweating, cold or heat intolerance. No polyuria or polydipsia.  Marland Kitchen   Physical Examination Today's Vitals   05/29/22 1030  BP: (!) 150/88  Pulse: 75  SpO2: 97%  Weight: 190 lb 9.6 oz (86.5 kg)  Height: 5\' 10"  (1.778 m)   Body mass index is 27.35 kg/m.  Gen: resting  comfortably, no acute distress HEENT: no scleral icterus, pupils equal round and reactive, no palptable cervical adenopathy,  CV: RRR, no m/r/g no jvd Resp: Clear to auscultation bilaterally GI: abdomen is soft, non-tender, non-distended, normal bowel sounds, no hepatosplenomegaly MSK: extremities are warm, no edema.  Skin: warm, no rash Neuro:  no focal deficits Psych: appropriate affect      Assessment and Plan  1.LE edema - benign echo from pcp - has resolved - will give prn lasix, discussed limiting sodium intake  2. Chest pain -unclear etiology, will obtain GXT to further evaluate - EKG today shows SR, no acute ischemic changes  3. History of CVA - continue ASA 81mg  - he reports side effects to atorva and he thinks pravastatin, try crestor 5mg  daily  4. HTN - above goal, increase norvasc to 10mg  daily.   F/u pending stress results      , M.D.

## 2022-05-29 NOTE — Patient Instructions (Addendum)
Medication Instructions:  Increase Norvasc to 10mg  daily  Begin Lasix 20mg  as needed for swelling  Begin Crestor 5mg  daily Decrease Aspirin to 81mg  daily  Continue all other medications.     Labwork: none  Testing/Procedures: Your physician has requested that you have an exercise tolerance test. For further information please visit . Please also follow instruction sheet, as given.  Office will contact with results via phone or letter.     Follow-Up:  Pending test results   Any Other Special Instructions Will Be Listed Below (If Applicable).   If you need a refill on your cardiac medications before your next appointment, please call your pharmacy.

## 2022-05-29 NOTE — Telephone Encounter (Signed)
Checking percert on the following patient for testing scheduled at [facility].     GXT   06/06/2022

## 2022-06-06 ENCOUNTER — Ambulatory Visit (HOSPITAL_COMMUNITY): Payer: BC Managed Care – PPO

## 2022-06-12 ENCOUNTER — Telehealth: Payer: Self-pay | Admitting: Cardiology

## 2022-06-12 DIAGNOSIS — R079 Chest pain, unspecified: Secondary | ICD-10-CM

## 2022-06-12 NOTE — Telephone Encounter (Signed)
Patient scheduled for plain GXT tomorrow.  Can he change to the Lexi ??

## 2022-06-12 NOTE — Telephone Encounter (Signed)
Calling to say that patient knees is giving him a hard time. Calling to see if there is another way that the patient cant take a stress test. Wife states that he is unable to do tomorrow procedure. Please advise

## 2022-06-12 NOTE — Telephone Encounter (Signed)
Can change to lexiscan for chest pain. WOuld need to reschedule at they will have to request the appropriate medications   Dominga Ferry MD

## 2022-06-12 NOTE — Telephone Encounter (Signed)
Sig other Glenice Laine) notified & verbalized understanding.  GXT has been cancelled for tomorrow.    New order for [facility] entered & sent to [facility] for scheduling.

## 2022-06-13 ENCOUNTER — Encounter (HOSPITAL_COMMUNITY): Payer: BC Managed Care – PPO

## 2022-06-15 ENCOUNTER — Other Ambulatory Visit (HOSPITAL_BASED_OUTPATIENT_CLINIC_OR_DEPARTMENT_OTHER): Payer: Self-pay | Admitting: *Deleted

## 2022-06-15 DIAGNOSIS — R079 Chest pain, unspecified: Secondary | ICD-10-CM

## 2022-06-19 ENCOUNTER — Ambulatory Visit (HOSPITAL_COMMUNITY)
Admission: RE | Admit: 2022-06-19 | Discharge: 2022-06-19 | Disposition: A | Discharge status: Home / Self Care | Payer: BC Managed Care – PPO | Source: Ambulatory Visit | Attending: Cardiology | Admitting: Cardiology

## 2022-06-19 DIAGNOSIS — R079 Chest pain, unspecified: Secondary | ICD-10-CM | POA: Diagnosis not present

## 2022-06-19 LAB — EXERCISE TOLERANCE TEST
Angina Index: 0
Duke Treadmill Score: 6
Estimated workload: 7
Exercise duration (min): 5 min
Exercise duration (sec): 53 s
MPHR: 159 {beats}/min
Peak HR: 141 {beats}/min
Percent HR: 88 %
RPE: 11
Rest HR: 60 {beats}/min
ST Depression (mm): 0 mm

## 2022-07-03 ENCOUNTER — Telehealth: Payer: Self-pay | Admitting: *Deleted

## 2022-07-03 NOTE — Telephone Encounter (Signed)
-----   Message from Antoine Poche, MD sent at 07/01/2022 11:39 AM EDT ----- Normal stress test, no evidence of any blockages in the heart.SHould f/u with pcp to consider noncardiac causes of symptoms. Can f/u with [place] 4 months   Dominga Ferry MD

## 2022-07-03 NOTE — Telephone Encounter (Signed)
Lesle Chris, LPN  2/[id number]/6811  1:44 PM EDT Back to Top    Notified, copy to pcp.

## 2022-08-18 ENCOUNTER — Other Ambulatory Visit: Payer: Self-pay | Admitting: Cardiology

## 2022-12-20 ENCOUNTER — Ambulatory Visit: Payer: BC Managed Care – PPO | Admitting: Cardiology

## 2023-08-22 ENCOUNTER — Other Ambulatory Visit: Payer: Self-pay

## 2023-08-22 ENCOUNTER — Other Ambulatory Visit (INDEPENDENT_AMBULATORY_CARE_PROVIDER_SITE_OTHER): Payer: BC Managed Care – PPO

## 2023-08-22 ENCOUNTER — Ambulatory Visit (INDEPENDENT_AMBULATORY_CARE_PROVIDER_SITE_OTHER): Payer: BC Managed Care – PPO | Admitting: Orthopaedic Surgery

## 2023-08-22 ENCOUNTER — Encounter: Payer: Self-pay | Admitting: Orthopaedic Surgery

## 2023-08-22 VITALS — Ht 69.0 in | Wt 182.0 lb

## 2023-08-22 DIAGNOSIS — G8929 Other chronic pain: Secondary | ICD-10-CM

## 2023-08-22 DIAGNOSIS — M25511 Pain in right shoulder: Secondary | ICD-10-CM

## 2023-08-22 DIAGNOSIS — M25512 Pain in left shoulder: Secondary | ICD-10-CM

## 2023-08-22 DIAGNOSIS — F17210 Nicotine dependence, cigarettes, uncomplicated: Secondary | ICD-10-CM

## 2023-08-22 NOTE — Patient Instructions (Signed)
Smoking Tobacco Information, Adult Smoking tobacco can be harmful to your health. Tobacco contains a toxic colorless chemical called nicotine. Nicotine causes changes in your brain that make you want more and more. This is called addiction. This can make it hard to stop smoking once you start. Tobacco also has other toxic chemicals that can hurt your body and raise your risk of many cancers. Menthol or "lite" tobacco or cigarette brands are not safer than regular brands. How can smoking tobacco affect me? Smoking tobacco puts you at risk for: Cancer. Smoking is most commonly associated with lung cancer, but can also lead to cancer in other parts of the body. Chronic obstructive pulmonary disease (COPD). This is a long-term lung condition that makes it hard to breathe. It also gets worse over time. High blood pressure (hypertension), heart disease, stroke, heart attack, and lung infections, such as pneumonia. Cataracts. This is when the lenses in the eyes become clouded. Digestive problems. This may include peptic ulcers, heartburn, and gastroesophageal reflux disease (GERD). Oral health problems, such as gum disease, mouth sores, and tooth loss. Loss of taste and smell. Smoking also affects how you look and smell. Smoking may cause: Wrinkles. Yellow or stained teeth, fingers, and fingernails. Bad breath. Bad-smelling clothes and hair. Smoking tobacco can also affect your social life, because: It may be challenging to find places to smoke when away from home. Many workplaces, restaurants, hotels, and public places are tobacco-free. Smoking is expensive. This is due to the cost of tobacco and the long-term costs of treating health problems from smoking. Secondhand smoke may affect those around you. Secondhand smoke can cause lung cancer, breathing problems, and heart disease. Children of smokers have a higher risk for: Sudden infant death syndrome (SIDS). Ear infections. Lung infections. What  actions can I take to prevent health problems? Quit smoking  Do not start smoking. Quit if you already smoke. Do not replace cigarette smoking with vaping devices, such as e-cigarettes. Make a plan to quit smoking and commit to it. Look for programs to help you, and ask your health care provider for recommendations and ideas. Set a date and write down all the reasons you want to quit. Let your friends and family know you are quitting so they can help and support you. Consider finding friends who also want to quit. It can be easier to quit with someone else, so that you can support each other. Talk with your health care provider about using nicotine replacement medicines to help you quit. These include gum, lozenges, patches, sprays, or pills. If you try to quit but return to smoking, stay positive. It is common to slip up when you first quit, so take it one day at a time. Be prepared for cravings. When you feel the urge to smoke, chew gum or suck on hard candy. Lifestyle Stay busy. Take care of your body. Get plenty of exercise, eat a healthy diet, and drink plenty of water. Find ways to manage your stress, such as meditation, yoga, exercise, or time spent with friends and family. Ask your health care provider about having regular tests (screenings) to check for cancer. This may include blood tests, imaging tests, and other tests. Where to find support To get support to quit smoking, consider: Asking your health care provider for more information and resources. Joining a support group for people who want to quit smoking in your local community. There are many effective programs that may help you to quit. Calling the smokefree.gov counselor   helpline at 1-800-QUIT-NOW (1-800-784-8669). Where to find more information You may find more information about quitting smoking from: Centers for Disease Control and Prevention: cdc.gov/tobacco Smokefree.gov: smokefree.gov American Lung Association:  freedomfromsmoking.org Contact a health care provider if: You have problems breathing. Your lips, nose, or fingers turn blue. You have chest pain. You are coughing up blood. You feel like you will faint. You have other health changes that cause you to worry. Summary Smoking tobacco can negatively affect your health, the health of those around you, your finances, and your social life. Do not start smoking. Quit if you already smoke. If you need help quitting, ask your health care provider. Consider joining a support group for people in your local community who want to quit smoking. There are many effective programs that may help you to quit. This information is not intended to replace advice given to you by your health care provider. Make sure you discuss any questions you have with your health care provider. Document Revised: 12/05/2021 Document Reviewed: 12/05/2021 Elsevier Patient Education  2024 Elsevier Inc.  

## 2023-08-22 NOTE — Progress Notes (Signed)
My shoulders hurt  He has developed pain in both shoulders over the last three months.  He has more pain in the left shoulder than the right.  He has no trauma.  He has pain with overhead use.  The left shoulder hurts when he lays on it at night.  He has no swelling, no numbness, no paresthesias.  He has no neck pain.  He has tried Tylenol and Advil with little help.  He is not getting any better.  Examination of left Upper Extremity is done.  Inspection:   Overall:  Elbow non-tender without crepitus or defects, forearm non-tender without crepitus or defects, wrist non-tender without crepitus or defects, hand non-tender.    Shoulder: with glenohumeral joint tenderness, without effusion.   Upper arm:  without swelling and tenderness   Range of motion:   Overall:  Full range of motion of the elbow, full range of motion of wrist and full range of motion in fingers.   Shoulder:  left  150 degrees forward flexion; 145 degrees abduction; 30 degrees internal rotation, 30 degrees external rotation, 10 degrees extension, 40 degrees adduction.   Stability:   Overall:  Shoulder, elbow and wrist stable   Strength and Tone:   Overall full shoulder muscles strength, full upper arm strength and normal upper arm bulk and tone.   Right shoulder has full ROM but pain in the extremes.  Left shoulder has pain to resisted abduction.  X-rays were done of both shoulders, reported separately.  Encounter Diagnoses  Name Primary?   Chronic right shoulder pain Yes   Chronic left shoulder pain    Nicotine dependence, cigarettes, uncomplicated     PROCEDURE NOTE:  The patient request injection, verbal consent was obtained.  The right shoulder was prepped appropriately after time out was performed.   Sterile technique was observed and injection of 1 cc of DepoMedrol 40mg  with several cc's of plain xylocaine. Anesthesia was provided by ethyl chloride and a 20-gauge needle was used to inject the shoulder  area. A posterior approach was used.  The injection was tolerated well.  A band aid dressing was applied.  The patient was advised to apply ice later today and tomorrow to the injection sight as needed.  PROCEDURE NOTE:  The patient request injection, verbal consent was obtained.  The left shoulder was prepped appropriately after time out was performed.   Sterile technique was observed and injection of 1 cc of DepoMedrol 40mg  with several cc's of plain xylocaine. Anesthesia was provided by ethyl chloride and a 20-gauge needle was used to inject the shoulder area. A posterior approach was used.  The injection was tolerated well.  A band aid dressing was applied.  The patient was advised to apply ice later today and tomorrow to the injection sight as needed.  Begin Aleve one twice a day after eating.  See Dr. Ophelia Charter in about three weeks.  He may need MRI of the left shoulder if it continues to have problems.  Call if any problem.  Precautions discussed.  Electronically Signed Darreld Mclean, MD 8/29/20249:37 AM

## 2023-09-11 ENCOUNTER — Ambulatory Visit: Payer: BC Managed Care – PPO | Admitting: Orthopaedic Surgery

## 2024-01-30 ENCOUNTER — Other Ambulatory Visit: Payer: Self-pay

## 2024-01-30 ENCOUNTER — Encounter: Payer: Self-pay | Admitting: Orthopaedic Surgery

## 2024-01-30 ENCOUNTER — Ambulatory Visit (INDEPENDENT_AMBULATORY_CARE_PROVIDER_SITE_OTHER): Payer: BC Managed Care – PPO | Admitting: Orthopaedic Surgery

## 2024-01-30 VITALS — Ht 70.0 in | Wt 186.0 lb

## 2024-01-30 DIAGNOSIS — G8929 Other chronic pain: Secondary | ICD-10-CM

## 2024-01-30 DIAGNOSIS — M542 Cervicalgia: Secondary | ICD-10-CM | POA: Diagnosis not present

## 2024-01-30 DIAGNOSIS — M25512 Pain in left shoulder: Secondary | ICD-10-CM

## 2024-01-30 NOTE — Progress Notes (Signed)
 Office Visit Note   Patient: Harold Guerrero           Date of Birth: 04/14/1961           MRN: [id number] Visit Date: 01/30/2024              Requested by: Maree Isles, MD [id number] [place],  [place] [address] PCP: Maree Isles, MD   Assessment & Plan: Visit Diagnoses:  1. Neck pain   2. Chronic left shoulder pain     Plan: Patient does have some significant spondylosis previous MRI 2018 substance stenosis but principal problem is been persistent left shoulder pain with impingement and abduction weakness.  Will proceed with left shoulder MRI office follow-up after scan for review.  Follow-Up Instructions: No follow-ups on file.   Orders:  Orders Placed This Encounter  Procedures   XR Cervical Spine 2 or 3 views   MR Shoulder Left w/o contrast   No orders of the defined types were placed in this encounter.     Procedures: No procedures performed   Clinical Data: No additional findings.   Subjective: Chief Complaint  Patient presents with   Right Shoulder - Pain   Left Shoulder - Pain    HPI 63 year old male seen with ongoing bilateral shoulder pain worse on the left than right.  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  Denies numbness or tingling in his fingers.  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  Patient denies myelopathic symptoms.  No history of gout.  No associated bowel or bladder symptoms no fever or chills.  History of TIA negative for CVA on MRI 2018.  Patient's had pain for greater than 6 months previous injection by Dr. Brenna 08/22/2023.  No relief with injection.  He states at times he has problems getting assured on, working opening and closing car doors due to shoulder pain.  Review of Systems all systems noncontributory to HPI.   Objective: Vital Signs: Ht 5' 10 (1.778 m)   Wt 186 lb (84.4 kg)    BMI 26.69 kg/m   Physical Exam Constitutional:      Appearance: He is well-developed.  HENT:     Head: Normocephalic and atraumatic.     Right Ear: External ear normal.     Left Ear: External ear normal.  Eyes:     Pupils: Pupils are equal, round, and reactive to light.  Neck:     Thyroid: No thyromegaly.     Trachea: No tracheal deviation.  Cardiovascular:     Rate and Rhythm: Normal rate.  Pulmonary:     Effort: Pulmonary effort is normal.     Breath sounds: No wheezing.  Abdominal:     General: Bowel sounds are normal.     Palpations: Abdomen is soft.  Musculoskeletal:     Cervical back: Neck supple.  Skin:    General: Skin is warm and dry.     Capillary Refill: Capillary refill takes less than 2 seconds.  Neurological:     Mental Status: He is alert and oriented to person, place, and time.  Psychiatric:        Behavior: Behavior normal.        Thought Content: Thought content normal.        Judgment: Judgment normal.     Ortho Exam  positive impingement left shoulder greater right shoulder he is closely brachioplexus tenderness but negative Spurling negative cervical compression test no changes of the distraction.  Upper extremity reflexes are 2+.  Long of the biceps right and left is normal.  Exquisite pain with resisted supraspinatus testing but negative drop arm test.  Subscap is strong lift off test negative right and left.  Specialty Comments:  No specialty comments available.  Imaging: No results found.   PMFS History: Patient Active Problem List   Diagnosis Date Noted   Pain in left ankle and joints of left foot 08/10/2021   Diverticulitis 10/12/2017   Aphasia 10/12/2017   Right arm weakness 10/12/2017   Essential hypertension 10/12/2017   Heart murmur 10/12/2017   Past Medical History:  Diagnosis Date   Bilateral edema of lower extremity    Cigarette nicotine dependence    Diverticulosis    Dizziness    Fatigue    GERD (gastroesophageal  reflux disease)    History of CVA (cerebrovascular accident)    History of Rocky Mountain spotted fever    Hypertension    Insomnia    Restless leg syndrome    Smoking    Tinea corporis     No family history on file.  No past surgical history on file. Social History   Occupational History   Not on file  Tobacco Use   Smoking status: Every Day    Current packs/day: 0.50    Types: Cigarettes   Smokeless tobacco: Never  Substance and Sexual Activity   Alcohol use: No   Drug use: No   Sexual activity: Not on file

## 2024-02-06 ENCOUNTER — Telehealth: Payer: Self-pay | Admitting: Radiology

## 2024-02-06 NOTE — Telephone Encounter (Signed)
Received denial for left shoulder MRI from [facility] due to patient not having six weeks of conservative treatment. He did have shoulder injection, however, it was 08/22/2023 with Dr. Hilda Lias. Please advise. Would you like to schedule peer to peer?

## 2024-02-11 NOTE — Telephone Encounter (Signed)
 I left voicemail for patient requesting return call to let me know where he would like to attend PT. This is mandatory per his insurance prior to approving MRI.

## 2024-02-13 NOTE — Telephone Encounter (Signed)
I left voicemail #2 for patient.

## 2024-02-20 NOTE — Telephone Encounter (Signed)
 Harold Guerrero spoke with patient.  He states that he will have to think about it and that his pain is so severe, he does not think PT will help at all. She explained his insurance required it and advised he can go to PT, get HEP and ROV in 4 weeks or so so we can try and resubmit. Patient says he will think about it and call back.

## 2024-02-20 NOTE — Telephone Encounter (Signed)
 Unable to reach patient x 3. Have given information to [facility] in [place] office in case he calls back there as well.

## 2024-02-24 ENCOUNTER — Telehealth: Payer: Self-pay | Admitting: Orthopaedic Surgery

## 2024-02-24 NOTE — Telephone Encounter (Signed)
 This is [facility] pt. Someone from that dept should be able to help with the appeal.

## 2024-02-24 NOTE — Telephone Encounter (Signed)
 Pt called stating insurance denied him of a MRI due to not enough supporting notes. Pt is asking for a call back from [facility] as soon as possible to get this resolved. Dr Ophelia Charter will need to appeal. Please call pt at [phone].
